# Patient Record
Sex: Female | Born: 2000 | Race: Black or African American | Hispanic: No | Marital: Single | Smoking: Former smoker
Health system: Southern US, Community
[De-identification: ages and names within clinical notes are randomized; demographics above are authoritative.]

## PROBLEM LIST (undated history)

## (undated) DIAGNOSIS — J45909 Unspecified asthma, uncomplicated: Secondary | ICD-10-CM

## (undated) DIAGNOSIS — Z9109 Other allergy status, other than to drugs and biological substances: Secondary | ICD-10-CM

---
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
## 2022-02-02 ENCOUNTER — Ambulatory Visit: Payer: BC Managed Care – PPO | Admitting: Family Medicine

## 2022-02-02 ENCOUNTER — Telehealth: Payer: Self-pay

## 2022-02-02 ENCOUNTER — Encounter: Payer: Self-pay | Admitting: Family Medicine

## 2022-02-02 ENCOUNTER — Other Ambulatory Visit: Payer: Self-pay | Admitting: Family Medicine

## 2022-02-02 VITALS — BP 102/78 | HR 57 | Temp 98.3°F | Resp 16 | Ht 64.0 in | Wt 179.8 lb

## 2022-02-02 DIAGNOSIS — B001 Herpesviral vesicular dermatitis: Secondary | ICD-10-CM

## 2022-02-02 DIAGNOSIS — J4520 Mild intermittent asthma, uncomplicated: Secondary | ICD-10-CM

## 2022-02-02 DIAGNOSIS — J302 Other seasonal allergic rhinitis: Secondary | ICD-10-CM

## 2022-02-02 DIAGNOSIS — Z0000 Encounter for general adult medical examination without abnormal findings: Secondary | ICD-10-CM | POA: Diagnosis not present

## 2022-02-02 DIAGNOSIS — L700 Acne vulgaris: Secondary | ICD-10-CM

## 2022-02-02 DIAGNOSIS — R5383 Other fatigue: Secondary | ICD-10-CM

## 2022-02-02 MED ORDER — QVAR REDIHALER 40 MCG/ACT IN AERB: 2.0000 | INHALATION_SPRAY | Freq: Two times a day (BID) | RESPIRATORY_TRACT | 5 refills | Status: DC

## 2022-02-02 MED ORDER — ALBUTEROL SULFATE HFA 108 (90 BASE) MCG/ACT IN AERS: 2.0000 | INHALATION_SPRAY | Freq: Four times a day (QID) | RESPIRATORY_TRACT | 5 refills | Status: DC | PRN

## 2022-02-02 MED ORDER — VALACYCLOVIR HCL 1 G PO TABS: 1000.0000 mg | ORAL_TABLET | Freq: Three times a day (TID) | ORAL | 5 refills | Status: AC

## 2022-02-02 MED ORDER — FLUTICASONE PROPIONATE 50 MCG/ACT NA SUSP: 2.0000 | Freq: Every day | NASAL | 5 refills | Status: DC

## 2022-02-02 MED ORDER — MINOCYCLINE HCL 50 MG PO TABS: 50.0000 mg | ORAL_TABLET | Freq: Two times a day (BID) | ORAL | 3 refills | Status: DC

## 2022-02-02 NOTE — Telephone Encounter (Signed)
Looks like Qvar needs prior auth but insurance will cover Pulmicort (which pharmacy already changed it to) or Flovent. ? ?Pharmacy comment: Alternative Requested:PA REQUIRED. NON-FORMULARY, FLOVENT PREFERRED. ?

## 2022-02-02 NOTE — Patient Instructions (Signed)
Acne Acne is a skin problem that causes pimples and other skin changes. The skin has many tiny openings called pores. Each pore contains an oil gland. Oil glands make an oily substance that is called sebum. Acne occurs when the pores in the skin get blocked. The pores may become infected with bacteria, or they may become red, sore, and swollen. Acne is a common skin problem, especially for teenagers. It often occurs on the face, neck, chest, upper arms, and back. Acne usually goes away over time. What are the causes? Acne is caused when oil glands get blocked with sebum, dead skin cells, and dirt. The bacteria that are normally found in the oil glands then multiply and cause inflammation. Acne is commonly triggered by changes in your hormones. These hormonal changes can cause the oil glands to get bigger and to make more sebum. Factors that can make acne worse include: Hormone changes during: Adolescence. Women's menstrual cycles. Pregnancy. Oil-based cosmetics and hair products. Stress. Hormone problems that are caused by certain diseases. Certain medicines. Pressure from headbands, backpacks, or shoulder pads. Exposure to certain oils and chemicals. Eating a diet high in carbohydrates that quickly turn to sugar. These include dairy products, desserts, and chocolates. What increases the risk? This condition is more likely to develop in: Teenagers. People who have a family history of acne. What are the signs or symptoms? Symptoms include: Small, red bumps (pimples or papules). Whiteheads. Blackheads. Small, pus-filled pimples (pustules). Big, red pimples or pustules that feel tender. More severe acne can cause: An abscess. This is an infected area that contains a collection of pus. Cysts. These are hard, painful, fluid-filled sacs. Scars. These can happen after large pimples heal. How is this diagnosed? This condition is diagnosed with a medical history and physical exam. Blood tests  may also be done. How is this treated? Treatment for this condition can vary depending on the severity of your acne. Treatment may include: Creams and lotions that prevent oil glands from clogging. Creams and lotions that treat or prevent infections and inflammation. Antibiotic medicines that are applied to the skin or taken as a pill. Pills that decrease sebum production. Birth control pills. Light or laser treatments. Injections of medicine into the affected areas. Chemicals that cause peeling of the skin. Surgery. Your health care provider will also recommend the best way to take care of your skin. Good skin care is the most important part of treatment. Follow these instructions at home: Skin care Take care of your skin as told by your health care provider. You may be told to do these things: Wash your skin gently at least two times each day, as well as: After you exercise. Before you go to bed. Use mild soap. Apply a water-based skin moisturizer after you wash your skin. Use a sunscreen or sunblock with SPF 30 or greater. This is especially important if you are using acne medicines. Choose cosmetics that will not block your oil glands (are noncomedogenic). Medicines Take over-the-counter and prescription medicines only as told by your health care provider. If you were prescribed an antibiotic medicine, apply it or take it as told by your health care provider. Do not stop using the antibiotic even if your condition improves. General instructions Keep your hair clean and off your face. If you have oily hair, shampoo your hair regularly or daily. Avoid wearing tight headbands or hats. Avoid picking or squeezing your pimples. That can make your acne worse and cause scarring. Shave gently and   only when necessary. Keep a food journal to figure out if any foods are linked to your acne. Avoid dairy products, desserts, and chocolates. Take steps to manage and reduce stress. Keep all  follow-up visits as told by your health care provider. This is important. Contact a health care provider if: Your acne is not better after eight weeks. Your acne gets worse. You have a large area of skin that is red or tender. You think that you are having side effects from any acne medicine. Summary Acne is a skin problem that causes pimples and other skin changes. Acne is a common skin problem, especially for teenagers. Acne usually goes away over time. Acne is commonly triggered by changes in your hormones. There are many other causes, such as stress, diet, and certain medicines. Follow your health care provider's instructions for how to take care of your skin. Good skin care is the most important part of treatment. Take over-the-counter and prescription medicines only as told by your health care provider. Contact your health care provider if you think that you are having side effects from any acne medicine. This information is not intended to replace advice given to you by your health care provider. Make sure you discuss any questions you have with your health care provider. Document Revised: 03/01/2018 Document Reviewed: 03/01/2018 Elsevier Patient Education  2022 [employer]  

## 2022-02-02 NOTE — Progress Notes (Signed)
? ?Subjective:  ? ?By signing my name below, I, Madison Hansen, attest that this documentation has been prepared under the direction and in the presence of Madison Spates DO, 02/02/2022   ? ? Patient ID: Madison Hansen, female    DOB: 03-20-2001, 21 y.o.   MRN: [id number] ? ?Chief Complaint  ?Patient presents with  ? New Patient (Initial Visit)  ? ? ?HPI ?Patient is in today for establishment of patient care. ? ?She complains of a cold sore on the lower lip that arisen 2 weeks ago. She notes that she occasionally does get cold sores. However, her recent cold sore has been persistent.  ? ?She complains of painful cramps during menstruation and pimple flare ups. Her pain is persistent for the first 3 to 4 days. She has regular periods and her cycle is between 20 - 21 days. She takes Pamprin to help alleviate pain. She is not interested in receiving birth control pills.  ? ?She complains of fatigue. She states that she has been going to sleep between 02:00 - 03:00 am and occasionally at 05:00 am. She gets up and goes to the gym around 06:00 am. She denies the possibility of pregnancy, STDs, or urinary symptoms.  ? ?She states that her allergies have been elevated. She reports that her allergies have been seasonal. She has been taking Claritin and OTC eye drops.  ? ?Her asthma has been well, she has been taking Albuterol a couple of times throughout the week.  ?  ?She reports that her grandmother has heart disease.  ?She regularly exercises by jogging and weight training.  ? ? ?Past Medical History:  ?Diagnosis Date  ? Asthma   ? Environmental allergies   ? ? ?Past Surgical History:  ?Procedure Laterality Date  ? TONSILLECTOMY  2008  ? ? ?No family history on file. ? ?Social History  ? ?Socioeconomic History  ? Marital status: Single  ?  Spouse name: Not on file  ? Number of children: Not on file  ? Years of education: Not on file  ? Highest education level: Not on file  ?Occupational History  ? Not on file   ?Tobacco Use  ? Smoking status: Former  ?  Types: E-cigarettes  ? Smokeless tobacco: Never  ?Vaping Use  ? Vaping Use: Former  ?Substance and Sexual Activity  ? Alcohol use: No  ? Drug use: No  ? Sexual activity: Not Currently  ?Other Topics Concern  ? Not on file  ?Social History Narrative  ? Not on file  ? ?Social Determinants of Health  ? ?Financial Resource Strain: Not on file  ?Food Insecurity: Not on file  ?Transportation Needs: Not on file  ?Physical Activity: Not on file  ?Stress: Not on file  ?Social Connections: Not on file  ?Intimate Partner Violence: Not on file  ? ? ?Outpatient Medications Prior to Visit  ?Medication Sig Dispense Refill  ? beclomethasone (QVAR) 40 MCG/ACT inhaler Inhale 2 puffs into the lungs 2 (two) times daily.    ? ibuprofen (ADVIL,MOTRIN) 100 MG chewable tablet Chew 200 mg by mouth every 8 (eight) hours as needed. For fever    ? loratadine (CLARITIN) 5 MG chewable tablet Chew 5 mg by mouth daily.    ? albuterol (PROVENTIL HFA;VENTOLIN HFA) 108 (90 BASE) MCG/ACT inhaler Inhale 2 puffs into the lungs every 6 (six) hours as needed. For shortness of breath    ? fluticasone (FLONASE) 50 MCG/ACT nasal spray Place 2 sprays into the nose daily.    ?  sertraline (ZOLOFT) 50 MG tablet Take 50 mg by mouth daily.    ? acetaminophen (TYLENOL) 80 MG chewable tablet Chew 240 mg by mouth every 4 (four) hours as needed. For fever (Patient not taking: Reported on 02/02/2022)    ? azithromycin (ZITHROMAX) 200 MG/5ML suspension Take 12.5 mLs (500 mg total) by mouth daily. 500mg  po qday day 1 then 250mg  po qday days 2-5 qs (Patient not taking: Reported on 02/02/2022) 37.5 mL 0  ? montelukast (SINGULAIR) 5 MG chewable tablet Chew 5 mg by mouth at bedtime. (Patient not taking: Reported on 02/02/2022)    ? ?No facility-administered medications prior to visit.  ? ? ?Allergies  ?Allergen Reactions  ? Other   ?  Lima Beans: Unknown  ? ? ?Review of Systems  ?Constitutional:  Positive for malaise/fatigue.   ?Gastrointestinal:  Positive for abdominal pain (During Menstruation).  ?Genitourinary: Negative.   ?Skin:   ?     (+) Pimple Flare - ups ?(+) Cold Sore on Lower Lip  ?Endo/Heme/Allergies:  Positive for environmental allergies.  ? ?   ?Objective:  ?  ?Physical Exam ?Constitutional:   ?   General: She is not in acute distress. ?   Appearance: Normal appearance. She is not ill-appearing.  ?HENT:  ?   Head: Normocephalic and atraumatic.  ?   Right Ear: Tympanic membrane, ear canal and external ear normal.  ?   Left Ear: Tympanic membrane, ear canal and external ear normal.  ?Eyes:  ?   Extraocular Movements: Extraocular movements intact.  ?   Pupils: Pupils are equal, round, and reactive to light.  ?Cardiovascular:  ?   Rate and Rhythm: Normal rate and regular rhythm.  ?   Heart sounds: Normal heart sounds. No murmur heard. ?  No gallop.  ?Pulmonary:  ?   Effort: Pulmonary effort is normal. No respiratory distress.  ?   Breath sounds: Normal breath sounds. No wheezing or rales.  ?Abdominal:  ?   General: Bowel sounds are normal. There is no distension.  ?   Palpations: Abdomen is soft.  ?   Tenderness: There is no abdominal tenderness. There is no guarding.  ?Skin: ?   General: Skin is warm and dry.  ?Neurological:  ?   Mental Status: She is alert and oriented to person, place, and time.  ?Psychiatric:     ?   Judgment: Judgment normal.  ? ? ?BP 102/78 (BP Location: Left Arm, Patient Position: Sitting, Cuff Size: Normal)   Pulse (!) 57   Temp 98.3 ?F (36.8 ?C) (Oral)   Resp 16   Ht 5\' 4"  (1.626 m)   Wt 179 lb 12.8 oz (81.6 kg)   LMP 01/15/2022   SpO2 99%   BMI 30.86 kg/m?  ?Wt Readings from Last 3 Encounters:  ?02/02/22 179 lb 12.8 oz (81.6 kg) (95 %, Z= 1.62)*  ?09/24/18 188 lb 15 oz (85.7 kg) (97 %, Z= 1.95)*  ?11/05/12 129 lb 1.6 oz (58.6 kg) (>99 %, Z= 2.39)*  ? ?* Growth percentiles are based on CDC (Girls, 2-20 Years) data.  ? ? ?Diabetic Foot Exam - Simple   ?No data filed ?  ? ?Lab Results  ?Component  Value Date  ? WBC 5.7 02/02/2022  ? HGB 12.2 02/02/2022  ? HCT 36.2 02/02/2022  ? PLT 226.0 02/02/2022  ? GLUCOSE 69 (L) 02/02/2022  ? CHOL 129 02/02/2022  ? TRIG 69.0 02/02/2022  ? HDL 46.50 02/02/2022  ? LDLCALC 68 02/02/2022  ? ALT  9 02/02/2022  ? AST 17 02/02/2022  ? NA 139 02/02/2022  ? K 3.9 02/02/2022  ? CL 106 02/02/2022  ? CREATININE 0.77 02/02/2022  ? BUN 9 02/02/2022  ? CO2 26 02/02/2022  ? TSH 1.72 02/02/2022  ? ? ?Lab Results  ?Component Value Date  ? TSH 1.72 02/02/2022  ? ?Lab Results  ?Component Value Date  ? WBC 5.7 02/02/2022  ? HGB 12.2 02/02/2022  ? HCT 36.2 02/02/2022  ? MCV 87.4 02/02/2022  ? PLT 226.0 02/02/2022  ? ?Lab Results  ?Component Value Date  ? NA 139 02/02/2022  ? K 3.9 02/02/2022  ? CO2 26 02/02/2022  ? GLUCOSE 69 (L) 02/02/2022  ? BUN 9 02/02/2022  ? CREATININE 0.77 02/02/2022  ? BILITOT 0.4 02/02/2022  ? ALKPHOS 35 (L) 02/02/2022  ? AST 17 02/02/2022  ? ALT 9 02/02/2022  ? PROT 6.4 02/02/2022  ? ALBUMIN 4.1 02/02/2022  ? CALCIUM 9.0 02/02/2022  ? ANIONGAP 9 09/24/2018  ? GFR 112.07 02/02/2022  ? ?Lab Results  ?Component Value Date  ? CHOL 129 02/02/2022  ? ?Lab Results  ?Component Value Date  ? HDL 46.50 02/02/2022  ? ?Lab Results  ?Component Value Date  ? LDLCALC 68 02/02/2022  ? ?Lab Results  ?Component Value Date  ? TRIG 69.0 02/02/2022  ? ?Lab Results  ?Component Value Date  ? CHOLHDL 3 02/02/2022  ? ?No results found for: HGBA1C ? ?   ?Assessment & Plan:  ? ?Problem List Items Addressed This Visit   ? ?  ? Unprioritized  ? Seasonal allergies  ? Relevant Medications  ? albuterol (VENTOLIN HFA) 108 (90 Base) MCG/ACT inhaler  ? fluticasone (FLONASE) 50 MCG/ACT nasal spray  ? Preventative health care - Primary  ?  ghm utd ?Check labs  ?See avs ? ?  ?  ? Relevant Orders  ? CBC with Differential/Platelet (Completed)  ? Comprehensive metabolic panel (Completed)  ? Lipid panel (Completed)  ? TSH (Completed)  ? Vitamin B12 (Completed)  ? VITAMIN D 25 Hydroxy (Vit-D Deficiency,  Fractures) (Completed)  ? Mild intermittent asthma without complication  ? Relevant Medications  ? albuterol (VENTOLIN HFA) 108 (90 Base) MCG/ACT inhaler  ? fluticasone (FLONASE) 50 MCG/ACT nasal spray  ? Cold sore  ? Acne vulgari

## 2022-02-02 NOTE — Telephone Encounter (Signed)
Pt's mother called back with pt present (Mom is on [place]) and stated pt forgot to ask for refill on Sertraline.  Pt will be out of the medication in 2 weeks. Pharmacy for refill will be [facility] on [address] ?

## 2022-02-03 ENCOUNTER — Other Ambulatory Visit: Payer: Self-pay | Admitting: Family Medicine

## 2022-02-03 LAB — COMPREHENSIVE METABOLIC PANEL
ALT: 9 U/L (ref 0–35)
AST: 17 U/L (ref 0–37)
Albumin: 4.1 g/dL (ref 3.5–5.2)
Alkaline Phosphatase: 35 U/L — ABNORMAL LOW (ref 47–119)
BUN: 9 mg/dL (ref 6–23)
CO2: 26 mEq/L (ref 19–32)
Calcium: 9 mg/dL (ref 8.4–10.5)
Chloride: 106 mEq/L (ref 96–112)
Creatinine, Ser: 0.77 mg/dL (ref 0.40–1.20)
GFR: 112.07 mL/min (ref >60.00)
Glucose, Bld: 69 mg/dL — ABNORMAL LOW (ref 70–99)
Potassium: 3.9 mEq/L (ref 3.5–5.1)
Sodium: 139 mEq/L (ref 135–145)
Total Bilirubin: 0.4 mg/dL (ref 0.2–1.2)
Total Protein: 6.4 g/dL (ref 6.0–8.3)

## 2022-02-03 LAB — CBC WITH DIFFERENTIAL/PLATELET
Basophils Absolute: 0 10*3/uL (ref 0.0–0.1)
Basophils Relative: 0.7 % (ref 0.0–3.0)
Eosinophils Absolute: 0.5 10*3/uL (ref 0.0–0.7)
Eosinophils Relative: 9.1 % — ABNORMAL HIGH (ref 0.0–5.0)
HCT: 36.2 % (ref 36.0–49.0)
Hemoglobin: 12.2 g/dL (ref 12.0–16.0)
Lymphocytes Relative: 30.5 % (ref 24.0–48.0)
Lymphs Abs: 1.7 10*3/uL (ref 0.7–4.0)
MCHC: 33.8 g/dL (ref 31.0–37.0)
MCV: 87.4 fl (ref 78.0–98.0)
Monocytes Absolute: 0.4 10*3/uL (ref 0.1–1.0)
Monocytes Relative: 6.7 % (ref 3.0–12.0)
Neutro Abs: 3 10*3/uL (ref 1.4–7.7)
Neutrophils Relative %: 53 % (ref 43.0–71.0)
Platelets: 226 10*3/uL (ref 150.0–575.0)
RBC: 4.15 Mil/uL (ref 3.80–5.70)
RDW: 13.1 % (ref 11.4–15.5)
WBC: 5.7 10*3/uL (ref 4.5–13.5)

## 2022-02-03 LAB — LIPID PANEL
Cholesterol: 129 mg/dL (ref 0–200)
HDL: 46.5 mg/dL (ref >39.00)
LDL Cholesterol: 68 mg/dL (ref 0–99)
NonHDL: 82.14
Total CHOL/HDL Ratio: 3
Triglycerides: 69 mg/dL (ref 0.0–149.0)
VLDL: 13.8 mg/dL (ref 0.0–40.0)

## 2022-02-03 LAB — VITAMIN B12: Vitamin B-12: 237 pg/mL (ref 211–911)

## 2022-02-03 LAB — TSH: TSH: 1.72 u[IU]/mL (ref 0.40–5.00)

## 2022-02-03 LAB — VITAMIN D 25 HYDROXY (VIT D DEFICIENCY, FRACTURES): VITD: 7.11 ng/mL — ABNORMAL LOW (ref 30.00–100.00)

## 2022-02-03 MED ORDER — SERTRALINE HCL 50 MG PO TABS: 50.0000 mg | ORAL_TABLET | Freq: Every day | ORAL | 1 refills | Status: DC

## 2022-02-03 NOTE — Telephone Encounter (Signed)
Did you discuss taking over this medication? ?

## 2022-02-03 NOTE — Telephone Encounter (Signed)
Patient notified

## 2022-02-10 MED ORDER — ERGOCALCIFEROL 1.25 MG (50000 UT) PO CAPS: 50000.0000 [IU] | ORAL_CAPSULE | ORAL | 1 refills | Status: AC

## 2022-02-15 DIAGNOSIS — J4520 Mild intermittent asthma, uncomplicated: Secondary | ICD-10-CM | POA: Insufficient documentation

## 2022-02-15 DIAGNOSIS — B001 Herpesviral vesicular dermatitis: Secondary | ICD-10-CM | POA: Insufficient documentation

## 2022-02-15 DIAGNOSIS — Z0000 Encounter for general adult medical examination without abnormal findings: Secondary | ICD-10-CM | POA: Insufficient documentation

## 2022-02-15 DIAGNOSIS — J302 Other seasonal allergic rhinitis: Secondary | ICD-10-CM | POA: Insufficient documentation

## 2022-02-15 DIAGNOSIS — L700 Acne vulgaris: Secondary | ICD-10-CM | POA: Insufficient documentation

## 2022-02-15 NOTE — Assessment & Plan Note (Signed)
ghm utd Check labs  See avs  

## 2022-05-04 ENCOUNTER — Encounter: Payer: Self-pay | Admitting: Family Medicine

## 2022-05-04 ENCOUNTER — Telehealth: Payer: Self-pay | Admitting: Family Medicine

## 2022-05-04 NOTE — Telephone Encounter (Signed)
Pt states she is having constant migraines with neck and back pain. Could not schedule an appt with her as she is in [place] right now, and even virtual won't bill correctly as she is out of state. She was seen at [facility] but medications are not helping. She would like to know if pcp can do anything.

## 2022-05-04 NOTE — Telephone Encounter (Signed)
Spoke with patient regarding appointment/pain.  Recommended to patient to try Tylenol as suggested by UC. Patient states she will start today.  Per UC note/provider, suggested patient return to UC for further evaluation/treatment if pain persists and since she is out of state.  Advised note will be sent to PCP for recommendations, however without PCP seeing patient, unlikely to be able to treat.  Patient verbalized understanding.

## 2022-05-14 ENCOUNTER — Telehealth: Payer: Self-pay | Admitting: Family Medicine

## 2022-05-14 NOTE — Telephone Encounter (Signed)
Patient states she is currently in [place] and a few days ago she went to the ED due to a migraine that was accompanied by back and neck pain. They did labs and advised her to follow up with PCP and a possible referral to a neurologist. Advised pt that she would most likely need to be assessed during an OV but she stated she wont be back in [place] until August 10th. OV was made for that date but pt would like to know if Dr. Laury Axon knows someone up in [place] she can see until then. They would like a call back to discuss. Please advise.

## 2022-05-18 NOTE — Telephone Encounter (Signed)
Pt called back. Left VM with below options

## 2022-05-22 ENCOUNTER — Other Ambulatory Visit: Payer: Self-pay

## 2022-05-22 DIAGNOSIS — H547 Unspecified visual loss: Secondary | ICD-10-CM

## 2022-05-22 DIAGNOSIS — H4710 Unspecified papilledema: Secondary | ICD-10-CM

## 2022-05-25 ENCOUNTER — Other Ambulatory Visit: Payer: Self-pay

## 2022-05-25 DIAGNOSIS — G932 Benign intracranial hypertension: Secondary | ICD-10-CM

## 2022-06-11 ENCOUNTER — Ambulatory Visit: Payer: BC Managed Care – PPO | Admitting: Family Medicine

## 2022-06-15 ENCOUNTER — Ambulatory Visit (INDEPENDENT_AMBULATORY_CARE_PROVIDER_SITE_OTHER): Payer: BC Managed Care – PPO | Admitting: Family Medicine

## 2022-06-15 ENCOUNTER — Encounter: Payer: Self-pay | Admitting: Family Medicine

## 2022-06-15 VITALS — BP 112/70 | HR 66 | Temp 97.7°F | Resp 16 | Ht 64.01 in | Wt 192.2 lb

## 2022-06-15 DIAGNOSIS — N760 Acute vaginitis: Secondary | ICD-10-CM | POA: Diagnosis not present

## 2022-06-15 DIAGNOSIS — R82998 Other abnormal findings in urine: Secondary | ICD-10-CM

## 2022-06-15 DIAGNOSIS — H547 Unspecified visual loss: Secondary | ICD-10-CM

## 2022-06-15 DIAGNOSIS — E669 Obesity, unspecified: Secondary | ICD-10-CM

## 2022-06-15 DIAGNOSIS — G932 Benign intracranial hypertension: Secondary | ICD-10-CM | POA: Insufficient documentation

## 2022-06-15 LAB — POC URINALSYSI DIPSTICK (AUTOMATED)
Bilirubin, UA: NEGATIVE
Blood, UA: NEGATIVE
Glucose, UA: NEGATIVE
Nitrite, UA: NEGATIVE
Protein, UA: NEGATIVE
Spec Grav, UA: 1.025 (ref 1.010–1.025)
Urobilinogen, UA: 0.2 E.U./dL
pH, UA: 6 (ref 5.0–8.0)

## 2022-06-15 MED ORDER — FLUCONAZOLE 150 MG PO TABS: ORAL_TABLET | ORAL | 0 refills | Status: DC

## 2022-06-15 NOTE — Patient Instructions (Signed)
Idiopathic Intracranial Hypertension  Idiopathic intracranial hypertension (IIH) is a condition that increases pressure around the brain. The fluid that surrounds the brain and spinal cord (cerebrospinal fluid, or CSF) increases and causes the pressure. Idiopathic means that the cause of this condition is not known. IIH affects the brain and spinal cord (neurological disorder). If this condition is not treated, it can cause vision loss or blindness. What are the causes? The cause of this condition is not known. What increases the risk? The following factors may make you more likely to develop this condition: Being very overweight (obese). Being a female between the ages of 63 and 70 years old, who has not gone through menopause. Taking certain medicines, such as birth control or steroids. What are the signs or symptoms? Symptoms of this condition include: Headaches. This is the most common symptom. Brief episodes of total blindness. Double vision, blurred vision, or poor side (peripheral) vision. Pain in the shoulders or neck. Nausea and vomiting. A sound like rushing water or a pulsing sound within the ears (pulsatile tinnitus), or ringing in the ears. How is this diagnosed? This condition may be diagnosed based on: Your symptoms and medical history. Imaging tests of the brain, such as: CT scan. MRI. Magnetic resonance venogram (MRV) to check the veins. Diagnostic lumbar puncture. This is a procedure to remove and examine a sample of cerebrospinal fluid. This procedure can determine whether too much fluid may be causing IIH. A thorough eye exam to check for swelling or nerve damage in the eyes. How is this treated? Treatment for this condition depends on the symptoms. The goal of treatment is to decrease the pressure around your brain. Common treatments include: Weight loss through healthy eating, salt restriction, and exercise, if you are overweight. Medicines to decrease the  production of spinal fluid and lower the pressure within your skull. Medicines to prevent or treat headaches. Other treatments may include: Surgery to place drains (shunts) in your brain for removing excess fluid. Lumbar puncture to remove excess cerebrospinal fluid. Follow these instructions at home: If you are overweight or obese, work with your health care provider to lose weight. Take over-the-counter and prescription medicines only as told by your health care provider. Ask your health care provider if the medicine prescribed to you requires you to avoid driving or using machinery. Do not use any products that contain nicotine or tobacco, such as cigarettes, e-cigarettes, and chewing tobacco. If you need help quitting, ask your health care provider. Keep all follow-up visits as told by your health care provider. This is important. Contact a health care provider if: You have changes in your vision, such as: Double vision. Blurred vision. Poor peripheral vision. Get help right away if: You have any of the following symptoms and they get worse or do not get better: Headaches. Nausea. Vomiting. Sudden trouble seeing. Summary Idiopathic intracranial hypertension (IIH) is a condition that increases pressure around the brain. The cause is not known (is idiopathic). The most common symptom of IIH is headaches. Vision changes, pain in the shoulders or neck, nausea, and vomiting may also occur. Treatment for this condition depends on your symptoms. The goal of treatment is to decrease the pressure around your brain. If you are overweight or obese, work with your health care provider to lose weight. Take over-the-counter and prescription medicines only as told by your health care provider. This information is not intended to replace advice given to you by your health care provider. Make sure you discuss  any questions you have with your health care provider. Document Revised: 09/30/2019 Document  Reviewed: 09/30/2019 Elsevier Patient Education  2023 Elsevier Inc.  

## 2022-06-15 NOTE — Assessment & Plan Note (Addendum)
Neurology scheduling LP Labs drawn per neuro request Notes from [facility] reviewed

## 2022-06-15 NOTE — Progress Notes (Signed)
Subjective:   By signing my name below, I, [name], attest that this documentation has been prepared under the direction and in the presence of [name] DO 06/15/2022   Patient ID: Madison Hansen, female    DOB: [birth year], 21 y.o.   MRN: [id number]  Chief Complaint  Patient presents with   Acne   Follow-up    HPI Patient is in today for an office visit. She is accompanied by her mother.   Her acne is okay. She has stopped taking 50 Mg of Minocycline per recommendation of Dr. Colletta Maryland.   Her mother was expecting a phone call from [facility] a few days ago for the patient to receive a lumbar puncture on 06/17/2022. However, the mother has not yet received a call from the office. The patient reports that her headaches are better but her change of vision has not yet returned. Her mother was informed that the patient needs a full blood work panel done during today's visit. The patient reports her IIH begun suddenly while she was out of town in [place].   Past Medical History:  Diagnosis Date   Asthma    Environmental allergies     Past Surgical History:  Procedure Laterality Date   TONSILLECTOMY  2008    No family history on file.  Social History   Socioeconomic History   Marital status: Single    Spouse name: Not on file   Number of children: Not on file   Years of education: Not on file   Highest education level: Not on file  Occupational History   Not on file  Tobacco Use   Smoking status: Former    Types: E-cigarettes   Smokeless tobacco: Never  Vaping Use   Vaping Use: Former  Substance and Sexual Activity   Alcohol use: No   Drug use: No   Sexual activity: Not Currently  Other Topics Concern   Not on file  Social History Narrative   Not on file   Social Determinants of Health   Financial Resource Strain: Not on file  Food Insecurity: Not on file  Transportation Needs: Not on file  Physical Activity: Not on file  Stress:  Not on file  Social Connections: Not on file  Intimate Partner Violence: Not on file    Outpatient Medications Prior to Visit  Medication Sig Dispense Refill   albuterol (VENTOLIN HFA) 108 (90 Base) MCG/ACT inhaler Inhale 2 puffs into the lungs every 6 (six) hours as needed. For shortness of breath 1 each 5   beclomethasone (QVAR) 40 MCG/ACT inhaler Inhale 2 puffs into the lungs 2 (two) times daily.     Budesonide (PULMICORT FLEXHALER) 90 MCG/ACT inhaler Inhale 1 puff into the lungs 2 (two) times daily. 1 each 5   ergocalciferol (VITAMIN D2) 1.25 MG (50000 UT) capsule Take 1 capsule (50,000 Units total) by mouth once a week. 12 capsule 1   fluticasone (FLONASE) 50 MCG/ACT nasal spray Place 2 sprays into both nostrils daily. 18.2 mL 5   ibuprofen (ADVIL,MOTRIN) 100 MG chewable tablet Chew 200 mg by mouth every 8 (eight) hours as needed. For fever     loratadine (CLARITIN) 5 MG chewable tablet Chew 5 mg by mouth daily.     minocycline (DYNACIN) 50 MG tablet Take 1 tablet (50 mg total) by mouth 2 (two) times daily. 60 tablet 3   sertraline (ZOLOFT) 50 MG tablet Take 1 tablet (50 mg total) by mouth daily. 90  tablet 1   No facility-administered medications prior to visit.    Allergies  Allergen Reactions   Other     Lima Beans: Unknown    Review of Systems  Constitutional:  Negative for fever and malaise/fatigue.  HENT:  Negative for congestion.   Eyes:  Positive for blurred vision.  Respiratory:  Negative for shortness of breath.   Cardiovascular:  Negative for chest pain, palpitations and leg swelling.  Gastrointestinal:  Negative for abdominal pain, blood in stool and nausea.  Genitourinary:  Negative for dysuria and frequency.  Musculoskeletal:  Negative for falls.  Skin:  Negative for rash.  Neurological:  Negative for dizziness, loss of consciousness and headaches.  Endo/Heme/Allergies:  Negative for environmental allergies.  Psychiatric/Behavioral:  Negative for depression.  The patient is not nervous/anxious.        Objective:    Physical Exam Vitals and nursing note reviewed.  Constitutional:      General: She is not in acute distress.    Appearance: Normal appearance. She is not ill-appearing.  HENT:     Head: Normocephalic and atraumatic.     Right Ear: External ear normal.     Left Ear: External ear normal.  Cardiovascular:     Rate and Rhythm: Normal rate and regular rhythm.     Heart sounds: Normal heart sounds. No murmur heard.    No gallop.  Pulmonary:     Effort: Pulmonary effort is normal. No respiratory distress.     Breath sounds: Normal breath sounds. No wheezing or rales.  Skin:    General: Skin is warm and dry.  Neurological:     Mental Status: She is alert and oriented to person, place, and time.  Psychiatric:        Judgment: Judgment normal.     BP 112/70 (BP Location: Left Arm, Patient Position: Sitting, Cuff Size: Normal)   Pulse 66   Temp 97.7 F (36.5 C) (Oral)   Resp 16   Ht 5' 4.01" (1.626 m)   Wt 192 lb 3.2 oz (87.2 kg)   SpO2 100%   BMI 32.98 kg/m  Wt Readings from Last 3 Encounters:  06/15/22 192 lb 3.2 oz (87.2 kg) (97 %, Z= 1.83)*  02/02/22 179 lb 12.8 oz (81.6 kg) (95 %, Z= 1.62)*  09/24/18 188 lb 15 oz (85.7 kg) (97 %, Z= 1.95)*   * Growth percentiles are based on CDC (Girls, 2-20 Years) data.    Diabetic Foot Exam - Simple   No data filed    Lab Results  Component Value Date   WBC 5.7 02/02/2022   HGB 12.2 02/02/2022   HCT 36.2 02/02/2022   PLT 226.0 02/02/2022   GLUCOSE 69 (L) 02/02/2022   CHOL 129 02/02/2022   TRIG 69.0 02/02/2022   HDL 46.50 02/02/2022   LDLCALC 68 02/02/2022   ALT 9 02/02/2022   AST 17 02/02/2022   NA 139 02/02/2022   K 3.9 02/02/2022   CL 106 02/02/2022   CREATININE 0.77 02/02/2022   BUN 9 02/02/2022   CO2 26 02/02/2022   TSH 1.72 02/02/2022    Lab Results  Component Value Date   TSH 1.72 02/02/2022   Lab Results  Component Value Date   WBC 5.7  02/02/2022   HGB 12.2 02/02/2022   HCT 36.2 02/02/2022   MCV 87.4 02/02/2022   PLT 226.0 02/02/2022   Lab Results  Component Value Date   NA 139 02/02/2022   K 3.9 02/02/2022  CO2 26 02/02/2022   GLUCOSE 69 (L) 02/02/2022   BUN 9 02/02/2022   CREATININE 0.77 02/02/2022   BILITOT 0.4 02/02/2022   ALKPHOS 35 (L) 02/02/2022   AST 17 02/02/2022   ALT 9 02/02/2022   PROT 6.4 02/02/2022   ALBUMIN 4.1 02/02/2022   CALCIUM 9.0 02/02/2022   ANIONGAP 9 09/24/2018   GFR 112.07 02/02/2022   Lab Results  Component Value Date   CHOL 129 02/02/2022   Lab Results  Component Value Date   HDL 46.50 02/02/2022   Lab Results  Component Value Date   LDLCALC 68 02/02/2022   Lab Results  Component Value Date   TRIG 69.0 02/02/2022   Lab Results  Component Value Date   CHOLHDL 3 02/02/2022   No results found for: "HGBA1C"     Assessment & Plan:   Problem List Items Addressed This Visit       Unprioritized   Idiopathic intracranial hypertension - Primary    Neurology scheduling LP Labs drawn per neuro request       Relevant Orders   CBC with Differential/Platelet   Comprehensive metabolic panel   Lipid panel   TSH   Vitamin B12   VITAMIN D 25 Hydroxy (Vit-D Deficiency, Fractures)   POCT Urinalysis Dipstick (Automated) (Completed)   RPR   Bartonella anitbody panel   Amb Ref to Medical Weight Management   Other Visit Diagnoses     Vision loss       Relevant Orders   CBC with Differential/Platelet   Comprehensive metabolic panel   Lipid panel   TSH   Vitamin B12   VITAMIN D 25 Hydroxy (Vit-D Deficiency, Fractures)   POCT Urinalysis Dipstick (Automated) (Completed)   Acute vaginitis       Relevant Medications   fluconazole (DIFLUCAN) 150 MG tablet   Obesity (BMI 30.0-34.9)       Relevant Orders   Amb Ref to Medical Weight Management   Leukocytes in urine       Relevant Orders   Urine Culture      Meds ordered this encounter  Medications    fluconazole (DIFLUCAN) 150 MG tablet    Sig: 1 po x1, may repeat in 3 days prn    Dispense:  2 tablet    Refill:  0    I, [name], DO, personally preformed the services described in this documentation.  All medical record entries made by the scribe were at my direction and in my presence.  I have reviewed the chart and discharge instructions (if applicable) and agree that the record reflects my personal performance and is accurate and complete. 06/15/2022   I,Amber Collins,acting as a scribe for [name], DO.,have documented all relevant documentation on the behalf of [name], DO,as directed by  [name], DO while in the presence of [name], DO.   [name], DO

## 2022-06-16 LAB — CBC WITH DIFFERENTIAL/PLATELET
Basophils Absolute: 0 10*3/uL (ref 0.0–0.1)
Basophils Relative: 0.5 % (ref 0.0–3.0)
Eosinophils Absolute: 0.1 10*3/uL (ref 0.0–0.7)
Eosinophils Relative: 1.8 % (ref 0.0–5.0)
HCT: 38.3 % (ref 36.0–49.0)
Hemoglobin: 12.7 g/dL (ref 12.0–16.0)
Lymphocytes Relative: 32.7 % (ref 24.0–48.0)
Lymphs Abs: 1.6 10*3/uL (ref 0.7–4.0)
MCHC: 33.2 g/dL (ref 31.0–37.0)
MCV: 89.2 fl (ref 78.0–98.0)
Monocytes Absolute: 0.4 10*3/uL (ref 0.1–1.0)
Monocytes Relative: 8.2 % (ref 3.0–12.0)
Neutro Abs: 2.8 10*3/uL (ref 1.4–7.7)
Neutrophils Relative %: 56.8 % (ref 43.0–71.0)
Platelets: 258 10*3/uL (ref 150.0–575.0)
RBC: 4.29 Mil/uL (ref 3.80–5.70)
RDW: 14.2 % (ref 11.4–15.5)
WBC: 5 10*3/uL (ref 4.5–13.5)

## 2022-06-16 LAB — COMPREHENSIVE METABOLIC PANEL
ALT: 16 U/L (ref 0–35)
AST: 26 U/L (ref 0–37)
Albumin: 4.3 g/dL (ref 3.5–5.2)
Alkaline Phosphatase: 43 U/L — ABNORMAL LOW (ref 47–119)
BUN: 8 mg/dL (ref 6–23)
CO2: 17 mEq/L — ABNORMAL LOW (ref 19–32)
Calcium: 8.5 mg/dL (ref 8.4–10.5)
Chloride: 111 mEq/L (ref 96–112)
Creatinine, Ser: 0.95 mg/dL (ref 0.40–1.20)
GFR: 86.88 mL/min (ref >60.00)
Glucose, Bld: 60 mg/dL — ABNORMAL LOW (ref 70–99)
Potassium: 3.8 mEq/L (ref 3.5–5.1)
Sodium: 138 mEq/L (ref 135–145)
Total Bilirubin: 0.4 mg/dL (ref 0.2–1.2)
Total Protein: 6.9 g/dL (ref 6.0–8.3)

## 2022-06-16 LAB — LIPID PANEL
Cholesterol: 120 mg/dL (ref 0–200)
HDL: 39.7 mg/dL (ref >39.00)
LDL Cholesterol: 67 mg/dL (ref 0–99)
NonHDL: 80.14
Total CHOL/HDL Ratio: 3
Triglycerides: 66 mg/dL (ref 0.0–149.0)
VLDL: 13.2 mg/dL (ref 0.0–40.0)

## 2022-06-16 LAB — VITAMIN B12: Vitamin B-12: 330 pg/mL (ref 211–911)

## 2022-06-16 LAB — RPR: RPR Ser Ql: NONREACTIVE

## 2022-06-16 LAB — VITAMIN D 25 HYDROXY (VIT D DEFICIENCY, FRACTURES): VITD: 17.71 ng/mL — ABNORMAL LOW (ref 30.00–100.00)

## 2022-06-16 LAB — TSH: TSH: 1.35 u[IU]/mL (ref 0.40–5.00)

## 2022-06-17 LAB — URINE CULTURE
MICRO NUMBER:: 13775213
SPECIMEN QUALITY:: ADEQUATE

## 2022-06-18 LAB — BARTONELLA ANTIBODY PANEL
B. henselae IgG Screen: NEGATIVE
B. henselae IgM Screen: NEGATIVE

## 2022-06-19 ENCOUNTER — Encounter: Payer: Self-pay | Admitting: Family Medicine

## 2022-06-19 MED ORDER — VALACYCLOVIR HCL 1 G PO TABS: 1000.0000 mg | ORAL_TABLET | Freq: Three times a day (TID) | ORAL | 0 refills | Status: AC

## 2022-08-25 ENCOUNTER — Other Ambulatory Visit: Payer: Self-pay | Admitting: Family Medicine

## 2022-09-29 ENCOUNTER — Encounter: Payer: Self-pay | Admitting: Family Medicine

## 2022-09-29 ENCOUNTER — Ambulatory Visit: Payer: BC Managed Care – PPO | Admitting: Family Medicine

## 2022-09-29 VITALS — BP 120/90 | HR 54 | Temp 98.0°F | Wt 191.0 lb

## 2022-09-29 DIAGNOSIS — F3281 Premenstrual dysphoric disorder: Secondary | ICD-10-CM | POA: Insufficient documentation

## 2022-09-29 DIAGNOSIS — R5383 Other fatigue: Secondary | ICD-10-CM | POA: Diagnosis not present

## 2022-09-29 DIAGNOSIS — R1013 Epigastric pain: Secondary | ICD-10-CM

## 2022-09-29 DIAGNOSIS — F338 Other recurrent depressive disorders: Secondary | ICD-10-CM

## 2022-09-29 DIAGNOSIS — R1011 Right upper quadrant pain: Secondary | ICD-10-CM

## 2022-09-29 LAB — POC URINALSYSI DIPSTICK (AUTOMATED)
Bilirubin, UA: NEGATIVE
Glucose, UA: NEGATIVE
Ketones, UA: NEGATIVE
Leukocytes, UA: NEGATIVE
Nitrite, UA: NEGATIVE
Protein, UA: NEGATIVE
Spec Grav, UA: 1.01 (ref 1.010–1.025)
Urobilinogen, UA: 0.2 E.U./dL
pH, UA: 7.5 (ref 5.0–8.0)

## 2022-09-29 LAB — POCT URINE PREGNANCY: Preg Test, Ur: NEGATIVE

## 2022-09-29 MED ORDER — PANTOPRAZOLE SODIUM 40 MG PO TBEC: 40.0000 mg | DELAYED_RELEASE_TABLET | Freq: Every day | ORAL | 1 refills | Status: AC

## 2022-09-29 NOTE — Assessment & Plan Note (Signed)
Pt restarted zoloft 1-2 weeks ago She does not want to start bcp

## 2022-09-29 NOTE — Progress Notes (Signed)
Established Patient Office Visit  Subjective   Patient ID: Madison Hansen, female    DOB: October 16, 2001  Age: 21 y.o. MRN: [id number]  No chief complaint on file.   HPI Pt is here with her mother c/o RUQ pain and midepigastric pain with burping x 4 days.  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  She is not sleeping well and takes no otc meds.  She c/o inc anxiety / depression worse due to stress and time of year.  She restarted her zoloft 2 weeks ago.  She stopped in in August.  She has a counselor she will call her to schedule an appointment .    Patient Active Problem List   Diagnosis Date Noted   Seasonal affective disorder ([place]) 09/29/2022   PMDD (premenstrual dysphoric disorder) 09/29/2022   Midepigastric pain 09/29/2022   RUQ pain 09/29/2022   Idiopathic intracranial hypertension 06/15/2022   Mild intermittent asthma without complication [id number]   Cold sore 02/15/2022   Seasonal allergies 02/15/2022   Preventative health care 02/15/2022   Acne vulgaris 02/15/2022   Past Medical History:  Diagnosis Date   Asthma    Environmental allergies    Past Surgical History:  Procedure Laterality Date   TONSILLECTOMY  2008   Social History   Tobacco Use   Smoking status: Former    Types: E-cigarettes   Smokeless tobacco: Never  Vaping Use   Vaping Use: Former  Substance Use Topics   Alcohol use: No   Drug use: No   Social History   Socioeconomic History   Marital status: Single    Spouse name: Not on file   Number of children: Not on file   Years of education: Not on file   Highest education level: Not on file  Occupational History   Not on file  Tobacco Use   Smoking status: Former    Types: E-cigarettes   Smokeless tobacco: Never  Vaping Use   Vaping Use: Former  Substance and Sexual Activity   Alcohol use: No   Drug use: No   Sexual activity: Not Currently  Other Topics  Concern   Not on file  Social History Narrative   Not on file   Social Determinants of Health   Financial Resource Strain: Not on file  Food Insecurity: Not on file  Transportation Needs: Not on file  Physical Activity: Not on file  Stress: Not on file  Social Connections: Not on file  Intimate Partner Violence: Not on file   No family status information on file.   History reviewed. No pertinent family history. Allergies  Allergen Reactions   Other     Lima Beans: Unknown      Review of Systems  Constitutional:  Negative for chills, fever and malaise/fatigue.  HENT:  Negative for congestion and hearing loss.   Eyes:  Negative for discharge.  Respiratory:  Negative for cough, sputum production and shortness of breath.   Cardiovascular:  Negative for chest pain, palpitations and leg swelling.  Gastrointestinal:  Negative for abdominal pain, blood in stool, constipation, diarrhea, heartburn, nausea and vomiting.  Genitourinary:  Negative for dysuria, frequency, hematuria and urgency.  Musculoskeletal:  Negative for back pain, falls and myalgias.  Skin:  Negative for rash.  Neurological:  Negative for dizziness, sensory change, loss of consciousness, weakness and headaches.  Endo/Heme/Allergies:  Negative for environmental allergies. Does not bruise/bleed  easily.  Psychiatric/Behavioral:  Negative for depression and suicidal ideas. The patient is not nervous/anxious and does not have insomnia.    Review of Systems  Constitutional: Negative for activity change, appetite change +fatigue   HENT: Negative for hearing loss, congestion, tinnitus and ear discharge.  dentist q37m Eyes: Negative for visual disturbance   Respiratory: Negative for cough, chest tightness and shortness of breath.   Cardiovascular: Negative for chest pain, palpitations and leg swelling.  Gastrointestinal: Negative for  diarrhea, constipation +abd pain  Genitourinary: Negative for urgency, frequency,  decreased urine volume and difficulty urinating.  Musculoskeletal: Negative  arthralgias and gait problem.  + r flank pain Skin: Negative for color change, pallor and rash.  Neurological: Negative for dizziness, light-headedness, numbness and headaches.  Hematological: Negative for adenopathy. Does not bruise/bleed easily.  Psychiatric/Behavioral: + depression and not sleeping       Objective:     BP (!) 120/90   Pulse (!) 54   Temp 98 F (36.7 C) (Oral)   Wt 191 lb (86.6 kg)   BMI 32.77 kg/m  BP Readings from Last 3 Encounters:  09/29/22 (!) 120/90  06/15/22 112/70  02/02/22 102/78   Wt Readings from Last 3 Encounters:  09/29/22 191 lb (86.6 kg) (96 %, Z= 1.80)*  06/15/22 192 lb 3.2 oz (87.2 kg) (97 %, Z= 1.83)*  02/02/22 179 lb 12.8 oz (81.6 kg) (95 %, Z= 1.62)*   * Growth percentiles are based on CDC (Girls, 2-20 Years) data.   SpO2 Readings from Last 3 Encounters:  06/15/22 100%  02/02/22 99%  09/24/18 98%      Physical Exam Vitals and nursing note reviewed.  Constitutional:      General: She is not in acute distress.    Appearance: She is well-developed and normal weight. She is not ill-appearing.  HENT:     Head: Normocephalic and atraumatic.     Mouth/Throat:     Mouth: Mucous membranes are moist.     Pharynx: Oropharynx is clear.  Eyes:     Conjunctiva/sclera: Conjunctivae normal.  Neck:     Thyroid: No thyromegaly.     Vascular: No carotid bruit or JVD.  Cardiovascular:     Rate and Rhythm: Normal rate and regular rhythm.     Heart sounds: Normal heart sounds. No murmur heard. Pulmonary:     Effort: Pulmonary effort is normal. No respiratory distress.     Breath sounds: Normal breath sounds. No wheezing or rales.  Chest:     Chest wall: No tenderness.  Abdominal:     General: There is no distension.     Tenderness: There is abdominal tenderness in the right upper quadrant and epigastric area. There is right CVA tenderness. There is no left CVA  tenderness, guarding or rebound. Negative signs include Murphy's sign, Rovsing's sign, McBurney's sign, psoas sign and obturator sign.  Musculoskeletal:     Cervical back: Normal range of motion and neck supple.  Neurological:     Mental Status: She is alert and oriented to person, place, and time.     Results for orders placed or performed in visit on 09/29/22  POCT Urinalysis Dipstick (Automated)  Result Value Ref Range   Color, UA yellow    Clarity, UA clear    Glucose, UA Negative Negative   Bilirubin, UA negative    Ketones, UA negative    Spec Grav, UA 1.010 1.010 - 1.025   Blood, UA moderate    pH, UA 7.5  5.0 - 8.0   Protein, UA Negative Negative   Urobilinogen, UA 0.2 0.2 or 1.0 E.U./dL   Nitrite, UA negative    Leukocytes, UA Negative Negative  POCT urine pregnancy  Result Value Ref Range   Preg Test, Ur Negative Negative    Last CBC Lab Results  Component Value Date   WBC 5.0 06/15/2022   HGB 12.7 06/15/2022   HCT 38.3 06/15/2022   MCV 89.2 06/15/2022   MCH 27.6 09/24/2018   RDW 14.2 06/15/2022   PLT 258.0 0000000   Last metabolic panel Lab Results  Component Value Date   GLUCOSE 60 (L) 06/15/2022   NA 138 06/15/2022   K 3.8 06/15/2022   CL 111 06/15/2022   CO2 17 (L) 06/15/2022   BUN 8 06/15/2022   CREATININE 0.95 06/15/2022   GFRNONAA NOT CALCULATED 09/24/2018   CALCIUM 8.5 06/15/2022   PROT 6.9 06/15/2022   ALBUMIN 4.3 06/15/2022   BILITOT 0.4 06/15/2022   ALKPHOS 43 (L) 06/15/2022   AST 26 06/15/2022   ALT 16 06/15/2022   ANIONGAP 9 09/24/2018   Last lipids Lab Results  Component Value Date   CHOL 120 06/15/2022   HDL 39.70 06/15/2022   LDLCALC 67 06/15/2022   TRIG 66.0 06/15/2022   CHOLHDL 3 06/15/2022   Last hemoglobin A1c No results found for: "HGBA1C" Last thyroid functions Lab Results  Component Value Date   TSH 1.35 06/15/2022   Last vitamin D Lab Results  Component Value Date   VD25OH 17.71 (L) 06/15/2022   Last  vitamin B12 and Folate Lab Results  Component Value Date   VITAMINB12 330 06/15/2022      The ASCVD Risk score (Arnett DK, et al., 2019) failed to calculate for the following reasons:   The 2019 ASCVD risk score is only valid for ages 84 to 29    Assessment & Plan:   Problem List Items Addressed This Visit       Unprioritized   Seasonal affective disorder ([place])    Con't zoloft  F/u in 1 month Or sooner prn F/u counselor  Pt denies suicidal ideation       RUQ pain - Primary    Check [place] abd  Check labs  Consider gi referral        Relevant Orders   POCT Urinalysis Dipstick (Automated) (Completed)   POCT urine pregnancy (Completed)   US Abdomen Complete   CBC with Differential/Platelet   Comprehensive metabolic panel   TSH   Vitamin B12   VITAMIN D 25 Hydroxy (Vit-D Deficiency, Fractures)   PMDD (premenstrual dysphoric disorder)    Pt restarted zoloft 1-2 weeks ago She does not want to start bcp      Midepigastric pain    Start protonix daily  Check labs and discussed foods to avoid        Relevant Medications   pantoprazole (PROTONIX) 40 MG tablet   Other Relevant Orders   POCT Urinalysis Dipstick (Automated) (Completed)   POCT urine pregnancy (Completed)   US Abdomen Complete   CBC with Differential/Platelet   Comprehensive metabolic panel   TSH   Vitamin B12   VITAMIN D 25 Hydroxy (Vit-D Deficiency, Fractures)   H. pylori breath test   Other Visit Diagnoses     Other fatigue       Relevant Orders   Epstein-Barr virus VCA antibody panel       Return in about 4 weeks (around 10/27/2022), or if symptoms worsen or fail  to improve, for depression.    Ann Held, DO

## 2022-09-29 NOTE — Patient Instructions (Signed)

## 2022-09-29 NOTE — Assessment & Plan Note (Signed)
Start protonix daily  Check labs and discussed foods to avoid

## 2022-09-29 NOTE — Assessment & Plan Note (Signed)
Con't zoloft  F/u in 1 month Or sooner prn F/u counselor  Pt denies suicidal ideation

## 2022-09-29 NOTE — Assessment & Plan Note (Signed)
Check [place] abd  Check labs  Consider gi referral

## 2022-09-30 NOTE — Telephone Encounter (Signed)
Received message from lowne to call patient to get her scheduled. Patient VM was full and could not leave voicemail. Tricia sent message below, I resent a message to her to call and schedule appt

## 2022-10-27 ENCOUNTER — Telehealth (HOSPITAL_BASED_OUTPATIENT_CLINIC_OR_DEPARTMENT_OTHER): Payer: Self-pay

## 2022-11-10 ENCOUNTER — Encounter: Payer: Self-pay | Admitting: Family Medicine

## 2022-11-10 ENCOUNTER — Ambulatory Visit: Payer: BC Managed Care – PPO | Admitting: Family Medicine

## 2022-11-10 VITALS — BP 110/80 | HR 72 | Temp 98.3°F | Resp 18 | Ht 64.02 in | Wt 188.2 lb

## 2022-11-10 DIAGNOSIS — K611 Rectal abscess: Secondary | ICD-10-CM | POA: Diagnosis not present

## 2022-11-10 MED ORDER — SULFAMETHOXAZOLE-TRIMETHOPRIM 800-160 MG PO TABS: 1.0000 | ORAL_TABLET | Freq: Two times a day (BID) | ORAL | 0 refills | Status: DC

## 2022-11-10 NOTE — Assessment & Plan Note (Signed)
Abx , epson salt bath , compresses  If it does not resolve with abx , return to office or call for referral

## 2022-11-10 NOTE — Progress Notes (Signed)
Subjective:   By signing my name below, I, Madison Hansen, attest that this documentation has been prepared under the direction and in the presence of Roma Schanz R. DO 11/10/2022   Patient ID: Madison Hansen, female    DOB: 09-28-2001, 22 y.o.   MRN: [id number]  Chief Complaint  Patient presents with   Mass    X3 days ago, painful, no discharge, right glute.    HPI Patient is in today for the evaluation of a bump on her right buttock.  She states she has what she described as a boil on the inside of her right buttock and has been experiencing pain in the area for the last 3 days, though she stated it may have formed earlier. She denies any drainage. She has attempted to squeeze it but nothing came out.   She has an allergy to tetracycline (and related) with hypertension as a reaction.   Over the summer she had frequent yeast infections.  She denies having any fever, new muscle pain, new joint pain, new moles, congestion, sinus pain, sore throat, chest pain, palpitations, cough, SOB, wheezing, n/v/d, constipation, blood in stool, dysuria, frequency, hematuria, or headaches at this time.   Past Medical History:  Diagnosis Date   Asthma    Environmental allergies     Past Surgical History:  Procedure Laterality Date   TONSILLECTOMY  2008    No family history on file.  Social History   Socioeconomic History   Marital status: Single    Spouse name: Not on file   Number of children: Not on file   Years of education: Not on file   Highest education level: Not on file  Occupational History   Not on file  Tobacco Use   Smoking status: Former    Types: E-cigarettes   Smokeless tobacco: Never  Vaping Use   Vaping Use: Former  Substance and Sexual Activity   Alcohol use: No   Drug use: No   Sexual activity: Not Currently  Other Topics Concern   Not on file  Social History Narrative   Not on file   Social Determinants of Health   Financial Resource  Strain: Not on file  Food Insecurity: Not on file  Transportation Needs: Not on file  Physical Activity: Not on file  Stress: Not on file  Social Connections: Not on file  Intimate Partner Violence: Not on file    Outpatient Medications Prior to Visit  Medication Sig Dispense Refill   albuterol (VENTOLIN HFA) 108 (90 Base) MCG/ACT inhaler Inhale 2 puffs into the lungs every 6 (six) hours as needed. For shortness of breath 1 each 5   beclomethasone (QVAR) 40 MCG/ACT inhaler Inhale 2 puffs into the lungs 2 (two) times daily.     Budesonide (PULMICORT FLEXHALER) 90 MCG/ACT inhaler Inhale 1 puff into the lungs 2 (two) times daily. 1 each 5   ergocalciferol (VITAMIN D2) 1.25 MG (50000 UT) capsule Take 1 capsule (50,000 Units total) by mouth once a week. 12 capsule 1   fluconazole (DIFLUCAN) 150 MG tablet 1 po x1, may repeat in 3 days prn 2 tablet 0   fluticasone (FLONASE) 50 MCG/ACT nasal spray Place 2 sprays into both nostrils daily. 18.2 mL 5   ibuprofen (ADVIL,MOTRIN) 100 MG chewable tablet Chew 200 mg by mouth every 8 (eight) hours as needed. For fever     loratadine (CLARITIN) 5 MG chewable tablet Chew 5 mg by mouth daily.     minocycline (  DYNACIN) 50 MG tablet Take 1 tablet (50 mg total) by mouth 2 (two) times daily. 60 tablet 3   pantoprazole (PROTONIX) 40 MG tablet Take 1 tablet (40 mg total) by mouth daily. 90 tablet 1   sertraline (ZOLOFT) 50 MG tablet TAKE 1 TABLET BY MOUTH EVERY DAY 90 tablet 1   No facility-administered medications prior to visit.    Allergies  Allergen Reactions   Tetracyclines & Related Hypertension   Other     Lima Beans: Unknown    Review of Systems  Constitutional:  Negative for fever and malaise/fatigue.  HENT:  Negative for congestion.   Eyes:  Negative for blurred vision.  Respiratory:  Negative for shortness of breath.   Cardiovascular:  Negative for chest pain, palpitations and leg swelling.  Gastrointestinal:  Negative for abdominal pain,  blood in stool and nausea.  Genitourinary:  Negative for dysuria and frequency.  Musculoskeletal:  Negative for falls.  Skin:  Negative for rash.  Neurological:  Negative for dizziness, loss of consciousness and headaches.  Endo/Heme/Allergies:  Negative for environmental allergies.  Psychiatric/Behavioral:  Negative for depression. The patient is not nervous/anxious.        Objective:    Physical Exam Vitals and nursing note reviewed.  Constitutional:      General: She is not in acute distress.    Appearance: Normal appearance.  HENT:     Head: Normocephalic and atraumatic.     Right Ear: External ear normal.     Left Ear: External ear normal.  Eyes:     Extraocular Movements: Extraocular movements intact.     Pupils: Pupils are equal, round, and reactive to light.  Cardiovascular:     Rate and Rhythm: Normal rate and regular rhythm.     Pulses: Normal pulses.     Heart sounds: Normal heart sounds. No murmur heard.    No gallop.  Pulmonary:     Effort: Pulmonary effort is normal. No respiratory distress.     Breath sounds: Normal breath sounds. No wheezing.  Skin:    General: Skin is warm and dry.     Findings: Abscess (perirectal abscess) and lesion present.     Comments: + tender mass r side perirectal  Min drainage   Neurological:     Mental Status: She is alert and oriented to person, place, and time.  Psychiatric:        Judgment: Judgment normal.     BP 110/80 (BP Location: Left Arm, Patient Position: Sitting, Cuff Size: Normal)   Pulse 72   Temp 98.3 F (36.8 C) (Oral)   Resp 18   Ht 5' 4.02" (1.626 m)   Wt 188 lb 3.2 oz (85.4 kg)   SpO2 98%   BMI 32.28 kg/m  Wt Readings from Last 3 Encounters:  11/10/22 188 lb 3.2 oz (85.4 kg) (96 %, Z= 1.75)*  09/29/22 191 lb (86.6 kg) (96 %, Z= 1.80)*  06/15/22 192 lb 3.2 oz (87.2 kg) (97 %, Z= 1.83)*   * Growth percentiles are based on CDC (Girls, 2-20 Years) data.    Diabetic Foot Exam - Simple   No data  filed    Lab Results  Component Value Date   WBC 5.0 06/15/2022   HGB 12.7 06/15/2022   HCT 38.3 06/15/2022   PLT 258.0 06/15/2022   GLUCOSE 60 (L) 06/15/2022   CHOL 120 06/15/2022   TRIG 66.0 06/15/2022   HDL 39.70 06/15/2022   LDLCALC 67 06/15/2022   ALT 16  06/15/2022   AST 26 06/15/2022   NA 138 06/15/2022   K 3.8 06/15/2022   CL 111 06/15/2022   CREATININE 0.95 06/15/2022   BUN 8 06/15/2022   CO2 17 (L) 06/15/2022   TSH 1.35 06/15/2022    Lab Results  Component Value Date   TSH 1.35 06/15/2022   Lab Results  Component Value Date   WBC 5.0 06/15/2022   HGB 12.7 06/15/2022   HCT 38.3 06/15/2022   MCV 89.2 06/15/2022   PLT 258.0 06/15/2022   Lab Results  Component Value Date   NA 138 06/15/2022   K 3.8 06/15/2022   CO2 17 (L) 06/15/2022   GLUCOSE 60 (L) 06/15/2022   BUN 8 06/15/2022   CREATININE 0.95 06/15/2022   BILITOT 0.4 06/15/2022   ALKPHOS 43 (L) 06/15/2022   AST 26 06/15/2022   ALT 16 06/15/2022   PROT 6.9 06/15/2022   ALBUMIN 4.3 06/15/2022   CALCIUM 8.5 06/15/2022   ANIONGAP 9 09/24/2018   GFR 86.88 06/15/2022   Lab Results  Component Value Date   CHOL 120 06/15/2022   Lab Results  Component Value Date   HDL 39.70 06/15/2022   Lab Results  Component Value Date   LDLCALC 67 06/15/2022   Lab Results  Component Value Date   TRIG 66.0 06/15/2022   Lab Results  Component Value Date   CHOLHDL 3 06/15/2022   No results found for: "HGBA1C"     Assessment & Plan:   Problem List Items Addressed This Visit       Unprioritized   Abscess, perirectal - Primary    Abx , epson salt bath , compresses  If it does not resolve with abx , return to office or call for referral       Relevant Medications   sulfamethoxazole-trimethoprim (BACTRIM DS) 800-160 MG tablet     Meds ordered this encounter  Medications   sulfamethoxazole-trimethoprim (BACTRIM DS) 800-160 MG tablet    Sig: Take 1 tablet by mouth 2 (two) times daily.     Dispense:  20 tablet    Refill:  0    I, [name], DO, personally preformed the services described in this documentation.  All medical record entries made by the scribe were at my direction and in my presence.  I have reviewed the chart and discharge instructions (if applicable) and agree that the record reflects my personal performance and is accurate and complete. 11/10/22   I,Rachel Rivera,acting as a scribe for [name], DO.,have documented all relevant documentation on the behalf of [name], DO,as directed by  [name], DO while in the presence of [name], DO.   [name], DO

## 2022-12-22 ENCOUNTER — Ambulatory Visit: Payer: BC Managed Care – PPO | Admitting: Family Medicine

## 2022-12-22 ENCOUNTER — Encounter: Payer: Self-pay | Admitting: Family Medicine

## 2022-12-22 VITALS — BP 118/78 | HR 67 | Temp 98.7°F | Resp 16 | Ht 64.02 in | Wt 191.8 lb

## 2022-12-22 DIAGNOSIS — L720 Epidermal cyst: Secondary | ICD-10-CM

## 2022-12-22 DIAGNOSIS — J0140 Acute pansinusitis, unspecified: Secondary | ICD-10-CM

## 2022-12-22 MED ORDER — LEVOCETIRIZINE DIHYDROCHLORIDE 5 MG PO TABS: 5.0000 mg | ORAL_TABLET | Freq: Every evening | ORAL | 5 refills | Status: AC

## 2022-12-22 MED ORDER — FLUTICASONE PROPIONATE 50 MCG/ACT NA SUSP: 2.0000 | Freq: Every day | NASAL | 6 refills | Status: AC

## 2022-12-22 MED ORDER — CEFUROXIME AXETIL 500 MG PO TABS: 500.0000 mg | ORAL_TABLET | Freq: Two times a day (BID) | ORAL | 0 refills | Status: AC

## 2022-12-22 NOTE — Progress Notes (Addendum)
Subjective:   By signing my name below, I, [name], attest that this documentation has been prepared under the direction and in the presence of Ann Held, DO. 12/22/2022   Patient ID: Madison Hansen, female    DOB: [birth year], 22 y.o.   MRN: [id number]  Chief Complaint  Patient presents with   Mass    Pt states noticing a bump in the back of her head. She scratched it and it started bleeding. Bleeding has stopped.   Sinus Problem    X2 days, pt states having SOB, chest pain, sinus pressure. NO COVID test or OTC meds    Sinus Problem Associated symptoms include congestion, shortness of breath (mild) and a sore throat (mild). Pertinent negatives include no coughing or headaches.   Patient is in today for a office visit.   She complains of a lesion on her head that started bleeding after she scratched it. She notes it is scabbing over at this time.  She also complains of congestion, sinus pressure, and mild sore throat. She is also having mild difficulty breathing. She denies having any fevers or cough at this time. She is not taking any medications to manage her symptoms.    Past Medical History:  Diagnosis Date   Asthma    Environmental allergies     Past Surgical History:  Procedure Laterality Date   TONSILLECTOMY  2008    History reviewed. No pertinent family history.  Social History   Socioeconomic History   Marital status: Single    Spouse name: Not on file   Number of children: Not on file   Years of education: Not on file   Highest education level: Not on file  Occupational History   Not on file  Tobacco Use   Smoking status: Former    Types: E-cigarettes   Smokeless tobacco: Never  Vaping Use   Vaping Use: Former  Substance and Sexual Activity   Alcohol use: No   Drug use: No   Sexual activity: Not Currently  Other Topics Concern   Not on file  Social History Narrative   Not on file   Social Determinants of Health    Financial Resource Strain: Not on file  Food Insecurity: Not on file  Transportation Needs: Not on file  Physical Activity: Not on file  Stress: Not on file  Social Connections: Not on file  Intimate Partner Violence: Not on file    Outpatient Medications Prior to Visit  Medication Sig Dispense Refill   albuterol (VENTOLIN HFA) 108 (90 Base) MCG/ACT inhaler Inhale 2 puffs into the lungs every 6 (six) hours as needed. For shortness of breath 1 each 5   beclomethasone (QVAR) 40 MCG/ACT inhaler Inhale 2 puffs into the lungs 2 (two) times daily.     Budesonide (PULMICORT FLEXHALER) 90 MCG/ACT inhaler Inhale 1 puff into the lungs 2 (two) times daily. 1 each 5   ergocalciferol (VITAMIN D2) 1.25 MG (50000 UT) capsule Take 1 capsule (50,000 Units total) by mouth once a week. 12 capsule 1   fluticasone (FLONASE) 50 MCG/ACT nasal spray Place 2 sprays into both nostrils daily. 18.2 mL 5   ibuprofen (ADVIL,MOTRIN) 100 MG chewable tablet Chew 200 mg by mouth every 8 (eight) hours as needed. For fever     loratadine (CLARITIN) 5 MG chewable tablet Chew 5 mg by mouth daily.     pantoprazole (PROTONIX) 40 MG tablet Take 1 tablet (40 mg total) by mouth daily. 90 tablet  1   sertraline (ZOLOFT) 50 MG tablet TAKE 1 TABLET BY MOUTH EVERY DAY 90 tablet 1   fluconazole (DIFLUCAN) 150 MG tablet 1 po x1, may repeat in 3 days prn (Patient not taking: Reported on 12/22/2022) 2 tablet 0   minocycline (DYNACIN) 50 MG tablet Take 1 tablet (50 mg total) by mouth 2 (two) times daily. (Patient not taking: Reported on 12/22/2022) 60 tablet 3   sulfamethoxazole-trimethoprim (BACTRIM DS) 800-160 MG tablet Take 1 tablet by mouth 2 (two) times daily. (Patient not taking: Reported on 12/22/2022) 20 tablet 0   No facility-administered medications prior to visit.    Allergies  Allergen Reactions   Tetracyclines & Related Hypertension   Other     Lima Beans: Unknown    Review of Systems  Constitutional:  Negative for  fever and malaise/fatigue.  HENT:  Positive for congestion, sinus pain and sore throat (mild).        (+)sinus pressure  Eyes:  Negative for blurred vision.  Respiratory:  Positive for shortness of breath (mild). Negative for cough.   Cardiovascular:  Negative for chest pain, palpitations and leg swelling.  Gastrointestinal:  Negative for abdominal pain, blood in stool and nausea.  Genitourinary:  Negative for dysuria and frequency.  Musculoskeletal:  Negative for falls.  Skin:  Negative for rash.       (+)lesion on scalp  Neurological:  Negative for dizziness, loss of consciousness and headaches.  Endo/Heme/Allergies:  Negative for environmental allergies.  Psychiatric/Behavioral:  Negative for depression. The patient is not nervous/anxious.        Objective:    Physical Exam Vitals and nursing note reviewed.  Constitutional:      General: She is not in acute distress.    Appearance: Normal appearance. She is not ill-appearing.  HENT:     Head: Normocephalic and atraumatic.     Right Ear: Tympanic membrane, ear canal and external ear normal.     Left Ear: Tympanic membrane, ear canal and external ear normal.     Nose:     Right Sinus: Maxillary sinus tenderness and frontal sinus tenderness present.     Left Sinus: Maxillary sinus tenderness and frontal sinus tenderness present.     Mouth/Throat:     Mouth: Mucous membranes are moist.     Pharynx: Oropharynx is clear. No oropharyngeal exudate or posterior oropharyngeal erythema.  Eyes:     Extraocular Movements: Extraocular movements intact.     Pupils: Pupils are equal, round, and reactive to light.  Cardiovascular:     Rate and Rhythm: Normal rate and regular rhythm.     Heart sounds: Normal heart sounds. No murmur heard.    No gallop.  Pulmonary:     Effort: Pulmonary effort is normal. No respiratory distress.     Breath sounds: Normal breath sounds. No wheezing or rales.     Comments: She tested negative for COVID-19  and Flu in office Lymphadenopathy:     Cervical: Cervical adenopathy present.  Skin:    General: Skin is warm and dry.  Neurological:     Mental Status: She is alert and oriented to person, place, and time.  Psychiatric:        Judgment: Judgment normal.     BP 118/78 (BP Location: Left Arm, Patient Position: Sitting, Cuff Size: Normal)   Pulse 67   Temp 98.7 F (37.1 C) (Oral)   Resp 16   Ht 5' 4.02" (1.626 m)   Wt 191 lb 12.8 oz (  87 kg)   SpO2 98%   BMI 32.90 kg/m  Wt Readings from Last 3 Encounters:  12/22/22 191 lb 12.8 oz (87 kg) (96 %, Z= 1.81)*  11/10/22 188 lb 3.2 oz (85.4 kg) (96 %, Z= 1.75)*  09/29/22 191 lb (86.6 kg) (96 %, Z= 1.80)*   * Growth percentiles are based on CDC (Girls, 2-20 Years) data.       Assessment & Plan:  Acute non-recurrent pansinusitis -     Cefuroxime Axetil; Take 1 tablet (500 mg total) by mouth 2 (two) times daily with a meal for 10 days.  Dispense: 20 tablet; Refill: 0 -     Fluticasone Propionate; Place 2 sprays into both nostrils daily.  Dispense: 16 g; Refill: 6 -     Levocetirizine Dihydrochloride; Take 1 tablet (5 mg total) by mouth every evening.  Dispense: 30 tablet; Refill: 5  Epidermoid cyst    I, Ann Held, DO, personally preformed the services described in this documentation.  All medical record entries made by the scribe were at my direction and in my presence.  I have reviewed the chart and discharge instructions (if applicable) and agree that the record reflects my personal performance and is accurate and complete. 12/22/2022   I,[name],acting as a scribe for Ann Held, DO.,have documented all relevant documentation on the behalf of Ann Held, DO,as directed by  Ann Held, DO while in the presence of Ann Held, DO.   Ann Held, DO

## 2022-12-22 NOTE — Patient Instructions (Signed)

## 2023-02-04 ENCOUNTER — Encounter: Payer: BC Managed Care – PPO | Admitting: Family Medicine

## 2023-02-04 ENCOUNTER — Other Ambulatory Visit: Payer: Self-pay | Admitting: Family Medicine

## 2023-02-15 ENCOUNTER — Other Ambulatory Visit: Payer: Self-pay | Admitting: Family Medicine

## 2023-02-15 ENCOUNTER — Encounter: Payer: Self-pay | Admitting: Family Medicine

## 2023-02-15 ENCOUNTER — Ambulatory Visit (INDEPENDENT_AMBULATORY_CARE_PROVIDER_SITE_OTHER): Payer: BC Managed Care – PPO | Admitting: Family Medicine

## 2023-02-15 VITALS — BP 110/78 | HR 64 | Temp 98.7°F | Resp 12 | Ht 64.0 in | Wt 193.6 lb

## 2023-02-15 DIAGNOSIS — J4521 Mild intermittent asthma with (acute) exacerbation: Secondary | ICD-10-CM

## 2023-02-15 DIAGNOSIS — J302 Other seasonal allergic rhinitis: Secondary | ICD-10-CM

## 2023-02-15 DIAGNOSIS — J4520 Mild intermittent asthma, uncomplicated: Secondary | ICD-10-CM

## 2023-02-15 MED ORDER — FLUCONAZOLE 150 MG PO TABS: 150.0000 mg | ORAL_TABLET | Freq: Every day | ORAL | 0 refills | Status: DC

## 2023-02-15 MED ORDER — ALBUTEROL SULFATE HFA 108 (90 BASE) MCG/ACT IN AERS: 2.0000 | INHALATION_SPRAY | Freq: Four times a day (QID) | RESPIRATORY_TRACT | 5 refills | Status: AC | PRN

## 2023-02-15 MED ORDER — PREDNISONE 10 MG PO TABS: ORAL_TABLET | ORAL | 0 refills | Status: DC

## 2023-02-15 MED ORDER — QVAR REDIHALER 80 MCG/ACT IN AERB: 2.0000 | INHALATION_SPRAY | Freq: Two times a day (BID) | RESPIRATORY_TRACT | 5 refills | Status: DC

## 2023-02-15 MED ORDER — AZITHROMYCIN 250 MG PO TABS: ORAL_TABLET | ORAL | 0 refills | Status: DC

## 2023-02-15 NOTE — Progress Notes (Signed)
Subjective:   By signing my name below, I, Madison Hansen, attest that this documentation has been prepared under the direction and in the presence of Seabron Spates R, DO. 02/15/2023.   Patient ID: Madison Hansen, female    DOB: Mar 14, 2001, 22 y.o.   MRN: [id number]  Chief Complaint  Patient presents with   Cough    HPI Patient is in today for an office visit.  Cough:  She complains of a persistent cough for the last 2 to 2.5 weeks, productive with phlegm. Her cough does not wake her up at night. She has associated feelings of pressure and tightness in her chest. She denies any fever or sore throat. For treatment she was taking DayQuil but finished it. Of note, she had recently attended a festival at her school. She has not been tested for Covid-19.   Asthma:  Lately she has experienced more frequent asthma attacks. She has been using her Qvar and albuterol more frequently as well. However, she complains of drowsiness as a side effect of her Qvar. Additionally she becomes easily winded with going upstairs, and develops coughing fits when she runs.   Past Medical History:  Diagnosis Date   Asthma    Environmental allergies     Past Surgical History:  Procedure Laterality Date   TONSILLECTOMY  2008    No family history on file.  Social History   Socioeconomic History   Marital status: Single    Spouse name: Not on file   Number of children: Not on file   Years of education: Not on file   Highest education level: Not on file  Occupational History   Not on file  Tobacco Use   Smoking status: Former    Types: E-cigarettes   Smokeless tobacco: Never  Vaping Use   Vaping Use: Former  Substance and Sexual Activity   Alcohol use: No   Drug use: No   Sexual activity: Not Currently  Other Topics Concern   Not on file  Social History Narrative   Not on file   Social Determinants of Health   Financial Resource Strain: Not on file  Food Insecurity: Not on file   Transportation Needs: Not on file  Physical Activity: Not on file  Stress: Not on file  Social Connections: Not on file  Intimate Partner Violence: Not on file    Outpatient Medications Prior to Visit  Medication Sig Dispense Refill   beclomethasone (QVAR) 40 MCG/ACT inhaler Inhale 2 puffs into the lungs 2 (two) times daily.     ergocalciferol (VITAMIN D2) 1.25 MG (50000 UT) capsule Take 1 capsule (50,000 Units total) by mouth once a week. 12 capsule 1   fluticasone (FLONASE) 50 MCG/ACT nasal spray Place 2 sprays into both nostrils daily. 16 g 6   ibuprofen (ADVIL,MOTRIN) 100 MG chewable tablet Chew 200 mg by mouth every 8 (eight) hours as needed. For fever     levocetirizine (XYZAL) 5 MG tablet Take 1 tablet (5 mg total) by mouth every evening. 30 tablet 5   loratadine (CLARITIN) 5 MG chewable tablet Chew 5 mg by mouth daily.     pantoprazole (PROTONIX) 40 MG tablet Take 1 tablet (40 mg total) by mouth daily. 90 tablet 1   sertraline (ZOLOFT) 50 MG tablet Take 1 tablet (50 mg total) by mouth daily. 90 tablet 1   albuterol (VENTOLIN HFA) 108 (90 Base) MCG/ACT inhaler Inhale 2 puffs into the lungs every 6 (six) hours as needed. For  shortness of breath 1 each 5   Budesonide (PULMICORT FLEXHALER) 90 MCG/ACT inhaler Inhale 1 puff into the lungs 2 (two) times daily. 1 each 5   fluticasone (FLONASE) 50 MCG/ACT nasal spray Place 2 sprays into both nostrils daily. 18.2 mL 5   No facility-administered medications prior to visit.    Allergies  Allergen Reactions   Tetracyclines & Related Hypertension   Other     Lima Beans: Unknown    Review of Systems  Constitutional:  Negative for fever and malaise/fatigue.  HENT:  Negative for congestion.   Eyes:  Negative for blurred vision.  Respiratory:  Positive for cough, sputum production and shortness of breath.   Cardiovascular:  Negative for chest pain, palpitations and leg swelling.       +Chest pressure/tightness  Gastrointestinal:   Negative for abdominal pain, blood in stool and nausea.  Genitourinary:  Negative for dysuria and frequency.  Musculoskeletal:  Negative for falls.  Skin:  Negative for rash.  Neurological:  Negative for dizziness, loss of consciousness and headaches.  Endo/Heme/Allergies:  Negative for environmental allergies.  Psychiatric/Behavioral:  Negative for depression. The patient is not nervous/anxious.   See HPI.     Objective:    Physical Exam Constitutional:      Appearance: Normal appearance.  HENT:     Head: Normocephalic and atraumatic.     Right Ear: Tympanic membrane, ear canal and external ear normal.     Left Ear: Tympanic membrane, ear canal and external ear normal.     Mouth/Throat:     Mouth: Mucous membranes are moist.     Pharynx: Oropharynx is clear. No oropharyngeal exudate or posterior oropharyngeal erythema.  Eyes:     Extraocular Movements: Extraocular movements intact.     Pupils: Pupils are equal, round, and reactive to light.  Cardiovascular:     Rate and Rhythm: Normal rate and regular rhythm.     Heart sounds: Normal heart sounds. No murmur heard.    No gallop.  Pulmonary:     Effort: Pulmonary effort is normal. No respiratory distress.     Breath sounds: Decreased breath sounds present. No wheezing or rales.     Comments: Decreased breath sounds throughout lungs bilaterally. Skin:    General: Skin is warm and dry.  Neurological:     General: No focal deficit present.     Mental Status: She is alert and oriented to person, place, and time.  Psychiatric:        Mood and Affect: Mood normal.        Behavior: Behavior normal.     BP 110/78 (BP Location: Right Arm, Cuff Size: Normal)   Pulse 64   Temp 98.7 F (37.1 C) (Oral)   Resp 12   Ht 5\' 4"  (1.626 m)   Wt 193 lb 9.6 oz (87.8 kg)   LMP 02/07/2023   SpO2 97%   BMI 33.23 kg/m  Wt Readings from Last 3 Encounters:  02/15/23 193 lb 9.6 oz (87.8 kg)  12/22/22 191 lb 12.8 oz (87 kg) (96 %, Z= 1.81)*   11/10/22 188 lb 3.2 oz (85.4 kg) (96 %, Z= 1.75)*   * Growth percentiles are based on CDC (Girls, 2-20 Years) data.    Diabetic Foot Exam - Simple   No data filed    Lab Results  Component Value Date   WBC 5.0 06/15/2022   HGB 12.7 06/15/2022   HCT 38.3 06/15/2022   PLT 258.0 06/15/2022   GLUCOSE 60 (  L) 06/15/2022   CHOL 120 06/15/2022   TRIG 66.0 06/15/2022   HDL 39.70 06/15/2022   LDLCALC 67 06/15/2022   ALT 16 06/15/2022   AST 26 06/15/2022   NA 138 06/15/2022   K 3.8 06/15/2022   CL 111 06/15/2022   CREATININE 0.95 06/15/2022   BUN 8 06/15/2022   CO2 17 (L) 06/15/2022   TSH 1.35 06/15/2022    Lab Results  Component Value Date   TSH 1.35 06/15/2022   Lab Results  Component Value Date   WBC 5.0 06/15/2022   HGB 12.7 06/15/2022   HCT 38.3 06/15/2022   MCV 89.2 06/15/2022   PLT 258.0 06/15/2022   Lab Results  Component Value Date   NA 138 06/15/2022   K 3.8 06/15/2022   CO2 17 (L) 06/15/2022   GLUCOSE 60 (L) 06/15/2022   BUN 8 06/15/2022   CREATININE 0.95 06/15/2022   BILITOT 0.4 06/15/2022   ALKPHOS 43 (L) 06/15/2022   AST 26 06/15/2022   ALT 16 06/15/2022   PROT 6.9 06/15/2022   ALBUMIN 4.3 06/15/2022   CALCIUM 8.5 06/15/2022   ANIONGAP 9 09/24/2018   GFR 86.88 06/15/2022   Lab Results  Component Value Date   CHOL 120 06/15/2022   Lab Results  Component Value Date   HDL 39.70 06/15/2022   Lab Results  Component Value Date   LDLCALC 67 06/15/2022   Lab Results  Component Value Date   TRIG 66.0 06/15/2022   Lab Results  Component Value Date   CHOLHDL 3 06/15/2022   No results found for: "HGBA1C"     Assessment & Plan:   Problem List Items Addressed This Visit       Unprioritized   Seasonal allergies   Relevant Medications   albuterol (VENTOLIN HFA) 108 (90 Base) MCG/ACT inhaler   Mild intermittent asthma without complication   Relevant Medications   albuterol (VENTOLIN HFA) 108 (90 Base) MCG/ACT inhaler   beclomethasone  (QVAR REDIHALER) 80 MCG/ACT inhaler   predniSONE (DELTASONE) 10 MG tablet   Other Visit Diagnoses     Bronchitis, asthmatic, mild intermittent, with acute exacerbation    -  Primary   Relevant Medications   azithromycin (ZITHROMAX Z-PAK) 250 MG tablet   albuterol (VENTOLIN HFA) 108 (90 Base) MCG/ACT inhaler   beclomethasone (QVAR REDIHALER) 80 MCG/ACT inhaler   predniSONE (DELTASONE) 10 MG tablet        Meds ordered this encounter  Medications   azithromycin (ZITHROMAX Z-PAK) 250 MG tablet    Sig: As directed    Dispense:  6 each    Refill:  0   albuterol (VENTOLIN HFA) 108 (90 Base) MCG/ACT inhaler    Sig: Inhale 2 puffs into the lungs every 6 (six) hours as needed. For shortness of breath    Dispense:  1 each    Refill:  5   beclomethasone (QVAR REDIHALER) 80 MCG/ACT inhaler    Sig: Inhale 2 puffs into the lungs 2 (two) times daily.    Dispense:  1 each    Refill:  5   predniSONE (DELTASONE) 10 MG tablet    Sig: TAKE 3 TABLETS PO QD FOR 3 DAYS THEN TAKE 2 TABLETS PO QD FOR 3 DAYS THEN TAKE 1 TABLET PO QD FOR 3 DAYS THEN TAKE 1/2 TAB PO QD FOR 3 DAYS    Dispense:  20 tablet    Refill:  0   fluconazole (DIFLUCAN) 150 MG tablet    Sig: Take 1 tablet (  150 mg total) by mouth daily. May repeat in 3 days if needed.    Dispense:  2 tablet    Refill:  0    I, [name], DO, personally preformed the services described in this documentation.  All medical record entries made by the scribe were at my direction and in my presence.  I have reviewed the chart and discharge instructions (if applicable) and agree that the record reflects my personal performance and is accurate and complete. 02/15/2023.  I,Mathew Stumpf,acting as a Neurosurgeon for [employer], DO.,have documented all relevant documentation on the behalf of [name], DO,as directed by  [name], DO while in the presence of [name], DO.   [name], DO

## 2023-02-16 ENCOUNTER — Encounter: Payer: BC Managed Care – PPO | Admitting: Family Medicine

## 2023-02-24 ENCOUNTER — Telehealth: Payer: Self-pay

## 2023-02-24 ENCOUNTER — Other Ambulatory Visit (HOSPITAL_COMMUNITY): Payer: Self-pay

## 2023-02-24 NOTE — Telephone Encounter (Signed)
PA pending, documenting in separate encounter. Please sign off as PA team doesn't have the ability to sign off on rx requests, thank you.

## 2023-02-24 NOTE — Telephone Encounter (Signed)
Patient Advocate Encounter   Received notification from [employer] that prior authorization for Qvar 80 mcg is required.   PA submitted on 02/24/2023 Key [id number] Insurance [employer] Status is pending

## 2023-03-01 NOTE — Telephone Encounter (Signed)
Pharmacy Patient Advocate Encounter  Prior Authorization for QVAR has been approved by [employer] (ins).    PA # [id number] Effective dates: 02/24/2023 through 02/24/2024

## 2023-03-02 ENCOUNTER — Encounter: Payer: Self-pay | Admitting: Family Medicine

## 2023-03-04 ENCOUNTER — Ambulatory Visit (INDEPENDENT_AMBULATORY_CARE_PROVIDER_SITE_OTHER): Payer: BC Managed Care – PPO | Admitting: Family Medicine

## 2023-03-04 ENCOUNTER — Other Ambulatory Visit: Payer: Self-pay | Admitting: Family Medicine

## 2023-03-04 ENCOUNTER — Encounter: Payer: Self-pay | Admitting: Family Medicine

## 2023-03-04 VITALS — BP 110/80 | HR 82 | Temp 98.5°F | Resp 18 | Ht 64.0 in | Wt 200.0 lb

## 2023-03-04 DIAGNOSIS — F3281 Premenstrual dysphoric disorder: Secondary | ICD-10-CM

## 2023-03-04 DIAGNOSIS — J4521 Mild intermittent asthma with (acute) exacerbation: Secondary | ICD-10-CM

## 2023-03-04 DIAGNOSIS — Z0001 Encounter for general adult medical examination with abnormal findings: Secondary | ICD-10-CM

## 2023-03-04 DIAGNOSIS — Z13220 Encounter for screening for lipoid disorders: Secondary | ICD-10-CM | POA: Diagnosis not present

## 2023-03-04 DIAGNOSIS — G932 Benign intracranial hypertension: Secondary | ICD-10-CM

## 2023-03-04 DIAGNOSIS — Z0000 Encounter for general adult medical examination without abnormal findings: Secondary | ICD-10-CM

## 2023-03-04 DIAGNOSIS — F418 Other specified anxiety disorders: Secondary | ICD-10-CM

## 2023-03-04 MED ORDER — ESCITALOPRAM OXALATE 10 MG PO TABS
10.0000 mg | ORAL_TABLET | Freq: Every day | ORAL | 2 refills | Status: DC
Start: 2023-03-04 — End: 2023-03-26

## 2023-03-04 MED ORDER — AZITHROMYCIN 250 MG PO TABS: ORAL_TABLET | ORAL | 0 refills | Status: DC

## 2023-03-04 MED ORDER — QVAR REDIHALER 80 MCG/ACT IN AERB: 2.0000 | INHALATION_SPRAY | Freq: Two times a day (BID) | RESPIRATORY_TRACT | 5 refills | Status: DC

## 2023-03-04 MED ORDER — PREDNISONE 10 MG PO TABS: ORAL_TABLET | ORAL | 0 refills | Status: DC

## 2023-03-04 NOTE — Assessment & Plan Note (Signed)
Ghm utd Check labs  See AVS  Health Maintenance  Topic Date Due   HIV Screening  Never done   Hepatitis C Screening  Never done   COVID-19 Vaccine (4 - 2023-24 season) 07/03/2022   INFLUENZA VACCINE  06/03/2023   DTaP/Tdap/Td (7 - Td or Tdap) 05/23/2024   HPV VACCINES  Completed

## 2023-03-04 NOTE — Assessment & Plan Note (Signed)
Lexapro daily

## 2023-03-04 NOTE — Progress Notes (Signed)
Subjective:   By signing my name below, I, Madison Hansen, attest that this documentation has been prepared under the direction and in the presence of [name], DO. 03/04/2023   Patient ID: Madison Hansen, female    DOB: 15-Mar-2001, 22 y.o.   MRN: [id number]  Chief Complaint  Patient presents with   Annual Exam    Pt states fasting     HPI Patient is in today for a comprehensive physical exam.   During last visit se was suffering from bronchitis. Since then her symptoms have resolved for a week but returned after that. She complains of cough, chest tightness, and muscle aches since the past weekend. She is taking OTC cough medication, Flonase, and allergy medication. She continues using albuterol inhaler 2-3x daily for her symptoms. She is not using her Qvar inhaler due to losing it. She denies sore throat.  She is no longer following up with a neurologist for her eyes.  She denies fever, new moles, congestion, sinus pain, chest pain, palpitations, wheezing, nausea, vomiting, abdominal pain, diarrhea, constipation, dysuria, frequency, hematuria, new muscle pain, new joint pain, or headaches at this time.  She has no new surgeries to report. She has no changes to family medical history. She occasionally drinks 2-3 shots of liquor every other weekend. She does not use drugs. She does not use tobacco products or vaping products. She is not sexually active.  She participates in regular exercise by doing weight training and running on the treadmill 3-d days weekly for at least 1 hours.  She is not seeing a GYN specialist at this time.  She is currently on her menstrual cycle. She reports developing abdominal cramps when having her menstrual cycle. She occasionally takes OTC medications to manage her cramps and finds occasional relief.  She is UTD on dental and vision care.    Past Medical History:  Diagnosis Date   Asthma    Environmental allergies     Past Surgical  History:  Procedure Laterality Date   TONSILLECTOMY  2008    History reviewed. No pertinent family history.  Social History   Socioeconomic History   Marital status: Single    Spouse name: Not on file   Number of children: Not on file   Years of education: Not on file   Highest education level: Not on file  Occupational History    Employer: UNEMPLOYED   Occupation: [employer]  Tobacco Use   Smoking status: Former    Types: E-cigarettes   Smokeless tobacco: Never  Vaping Use   Vaping Use: Former  Substance and Sexual Activity   Alcohol use: Yes    Alcohol/week: 3.0 standard drinks of alcohol    Types: 3 Shots of liquor per week   Drug use: No   Sexual activity: Not Currently    Partners: Male  Other Topics Concern   Not on file  Social History Narrative   Exercise -- weights, running -- 3-4 days a week for 1 - 1 1/2 hours    Social Determinants of Health   Financial Resource Strain: Not on file  Food Insecurity: Not on file  Transportation Needs: Not on file  Physical Activity: Not on file  Stress: Not on file  Social Connections: Not on file  Intimate Partner Violence: Not on file    Outpatient Medications Prior to Visit  Medication Sig Dispense Refill   albuterol (VENTOLIN HFA) 108 (90 Base) MCG/ACT inhaler Inhale 2 puffs into the  lungs every 6 (six) hours as needed. For shortness of breath 1 each 5   beclomethasone (QVAR) 40 MCG/ACT inhaler Inhale 2 puffs into the lungs 2 (two) times daily.     ergocalciferol (VITAMIN D2) 1.25 MG (50000 UT) capsule Take 1 capsule (50,000 Units total) by mouth once a week. 12 capsule 1   fluticasone (FLONASE) 50 MCG/ACT nasal spray Place 2 sprays into both nostrils daily. 16 g 6   ibuprofen (ADVIL,MOTRIN) 100 MG chewable tablet Chew 200 mg by mouth every 8 (eight) hours as needed. For fever     levocetirizine (XYZAL) 5 MG tablet Take 1 tablet (5 mg total) by mouth every evening. 30 tablet 5   loratadine (CLARITIN) 5 MG  chewable tablet Chew 5 mg by mouth daily.     pantoprazole (PROTONIX) 40 MG tablet Take 1 tablet (40 mg total) by mouth daily. 90 tablet 1   beclomethasone (QVAR REDIHALER) 80 MCG/ACT inhaler Inhale 2 puffs into the lungs 2 (two) times daily. 1 each 5   sertraline (ZOLOFT) 50 MG tablet Take 1 tablet (50 mg total) by mouth daily. 90 tablet 1   azithromycin (ZITHROMAX Z-PAK) 250 MG tablet As directed (Patient not taking: Reported on 03/04/2023) 6 each 0   fluconazole (DIFLUCAN) 150 MG tablet Take 1 tablet (150 mg total) by mouth daily. May repeat in 3 days if needed. (Patient not taking: Reported on 03/04/2023) 2 tablet 0   predniSONE (DELTASONE) 10 MG tablet TAKE 3 TABLETS PO QD FOR 3 DAYS THEN TAKE 2 TABLETS PO QD FOR 3 DAYS THEN TAKE 1 TABLET PO QD FOR 3 DAYS THEN TAKE 1/2 TAB PO QD FOR 3 DAYS (Patient not taking: Reported on 03/04/2023) 20 tablet 0   No facility-administered medications prior to visit.    Allergies  Allergen Reactions   Tetracyclines & Related Hypertension   Other     Lima Beans: Unknown    Review of Systems  Constitutional:  Negative for fever and malaise/fatigue.  HENT:  Negative for congestion and sinus pain.   Eyes:  Negative for blurred vision.  Respiratory:  Positive for cough. Negative for shortness of breath and wheezing.        (+)chest tightness   Cardiovascular:  Negative for chest pain, palpitations and leg swelling.  Gastrointestinal:  Negative for abdominal pain, blood in stool, constipation, diarrhea, nausea and vomiting.  Genitourinary:  Negative for dysuria, frequency and hematuria.  Musculoskeletal:  Negative for falls.       (+)general muscle aches  Skin:  Negative for rash.       (-)New moles  Neurological:  Negative for dizziness, loss of consciousness and headaches.  Endo/Heme/Allergies:  Negative for environmental allergies.  Psychiatric/Behavioral:  Negative for depression. The patient is not nervous/anxious.        Objective:    Physical  Exam Vitals and nursing note reviewed.  Constitutional:      General: She is not in acute distress.    Appearance: Normal appearance. She is not ill-appearing.  HENT:     Head: Normocephalic and atraumatic.     Right Ear: Tympanic membrane, ear canal and external ear normal.     Left Ear: Tympanic membrane, ear canal and external ear normal.     Mouth/Throat:     Mouth: Mucous membranes are moist.     Pharynx: Oropharynx is clear. No oropharyngeal exudate or posterior oropharyngeal erythema.  Eyes:     Extraocular Movements: Extraocular movements intact.     Pupils:  Pupils are equal, round, and reactive to light.  Cardiovascular:     Rate and Rhythm: Normal rate and regular rhythm.     Heart sounds: Normal heart sounds. No murmur heard.    No gallop.  Pulmonary:     Effort: Pulmonary effort is normal. No respiratory distress.     Breath sounds: Normal breath sounds. No wheezing or rales.  Abdominal:     General: Bowel sounds are normal. There is no distension.     Palpations: Abdomen is soft.     Tenderness: There is no abdominal tenderness. There is no guarding.  Skin:    General: Skin is warm and dry.  Neurological:     General: No focal deficit present.     Mental Status: She is alert and oriented to person, place, and time.  Psychiatric:        Mood and Affect: Mood normal.        Judgment: Judgment normal.     BP 110/80 (BP Location: Left Arm, Patient Position: Sitting, Cuff Size: Normal)   Pulse 82   Temp 98.5 F (36.9 C) (Oral)   Resp 18   Ht 5\' 4"  (1.626 m)   Wt 200 lb (90.7 kg)   LMP 03/04/2023   SpO2 96%   BMI 34.33 kg/m  Wt Readings from Last 3 Encounters:  03/04/23 200 lb (90.7 kg)  02/15/23 193 lb 9.6 oz (87.8 kg)  12/22/22 191 lb 12.8 oz (87 kg) (96 %, Z= 1.81)*   * Growth percentiles are based on CDC (Girls, 2-20 Years) data.       Assessment & Plan:  Preventative health care Assessment & Plan: Ghm utd Check labs  See AVS  Health  Maintenance  Topic Date Due   HIV Screening  Never done   Hepatitis C Screening  Never done   COVID-19 Vaccine (4 - 2023-24 season) 07/03/2022   INFLUENZA VACCINE  06/03/2023   DTaP/Tdap/Td (7 - Td or Tdap) 05/23/2024   HPV VACCINES  Completed     Orders: -     CBC with Differential/Platelet -     Comprehensive metabolic panel -     Lipid panel -     TSH  Bronchitis, asthmatic, mild intermittent, with acute exacerbation -     predniSONE; TAKE 3 TABLETS PO QD FOR 3 DAYS THEN TAKE 2 TABLETS PO QD FOR 3 DAYS THEN TAKE 1 TABLET PO QD FOR 3 DAYS THEN TAKE 1/2 TAB PO QD FOR 3 DAYS  Dispense: 20 tablet; Refill: 0 -     Azithromycin; As directed  Dispense: 6 each; Refill: 0 -     Qvar RediHaler; Inhale 2 puffs into the lungs 2 (two) times daily.  Dispense: 1 each; Refill: 5 -     COVID-19, Flu A+B and RSV  Idiopathic intracranial hypertension  Depression with anxiety -     Escitalopram Oxalate; Take 1 tablet (10 mg total) by mouth daily.  Dispense: 30 tablet; Refill: 2  PMDD (premenstrual dysphoric disorder) Assessment & Plan: Lexapro daily       I, [name], DO, personally preformed the services described in this documentation.  All medical record entries made by the scribe were at my direction and in my presence.  I have reviewed the chart and discharge instructions (if applicable) and agree that the record reflects my personal performance and is accurate and complete. 03/04/2023   I,Madison Hansen,acting as a scribe for [employer], DO.,have documented all  relevant documentation on the behalf of Miquel Held, DO,as directed by  Luevenia Held, DO while in the presence of Dioselina Held, DO.   Melony Held, DO

## 2023-03-04 NOTE — Telephone Encounter (Signed)
Alternative Requested:$271 ON INSURANCE. ANY OTHER OPTIONS?

## 2023-03-05 LAB — LIPID PANEL
Cholesterol: 139 mg/dL (ref 0–200)
HDL: 52.7 mg/dL (ref >39.00)
LDL Cholesterol: 77 mg/dL (ref 0–99)
NonHDL: 86.78
Total CHOL/HDL Ratio: 3
Triglycerides: 50 mg/dL (ref 0.0–149.0)
VLDL: 10 mg/dL (ref 0.0–40.0)

## 2023-03-05 LAB — COMPREHENSIVE METABOLIC PANEL
ALT: 11 U/L (ref 0–35)
AST: 18 U/L (ref 0–37)
Albumin: 3.7 g/dL (ref 3.5–5.2)
Alkaline Phosphatase: 41 U/L (ref 39–117)
BUN: 6 mg/dL (ref 6–23)
CO2: 27 mEq/L (ref 19–32)
Calcium: 8.6 mg/dL (ref 8.4–10.5)
Chloride: 107 mEq/L (ref 96–112)
Creatinine, Ser: 0.8 mg/dL (ref 0.40–1.20)
GFR: 106.24 mL/min (ref >60.00)
Glucose, Bld: 68 mg/dL — ABNORMAL LOW (ref 70–99)
Potassium: 4.1 mEq/L (ref 3.5–5.1)
Sodium: 141 mEq/L (ref 135–145)
Total Bilirubin: 0.5 mg/dL (ref 0.2–1.2)
Total Protein: 6.3 g/dL (ref 6.0–8.3)

## 2023-03-05 LAB — CBC WITH DIFFERENTIAL/PLATELET
Basophils Absolute: 0 10*3/uL (ref 0.0–0.1)
Basophils Relative: 0.7 % (ref 0.0–3.0)
Eosinophils Absolute: 0.5 10*3/uL (ref 0.0–0.7)
Eosinophils Relative: 6.8 % — ABNORMAL HIGH (ref 0.0–5.0)
HCT: 38.8 % (ref 36.0–46.0)
Hemoglobin: 12.9 g/dL (ref 12.0–15.0)
Lymphocytes Relative: 16.8 % (ref 12.0–46.0)
Lymphs Abs: 1.1 10*3/uL (ref 0.7–4.0)
MCHC: 33.2 g/dL (ref 30.0–36.0)
MCV: 88 fl (ref 78.0–100.0)
Monocytes Absolute: 0.6 10*3/uL (ref 0.1–1.0)
Monocytes Relative: 9.3 % (ref 3.0–12.0)
Neutro Abs: 4.5 10*3/uL (ref 1.4–7.7)
Neutrophils Relative %: 66.4 % (ref 43.0–77.0)
Platelets: 252 10*3/uL (ref 150.0–400.0)
RBC: 4.41 Mil/uL (ref 3.87–5.11)
RDW: 13.5 % (ref 11.5–14.6)
WBC: 6.7 10*3/uL (ref 4.5–10.5)

## 2023-03-05 LAB — TSH: TSH: 1.41 u[IU]/mL (ref 0.35–5.50)

## 2023-03-06 LAB — COVID-19, FLU A+B AND RSV
Influenza A, NAA: NOT DETECTED
Influenza B, NAA: NOT DETECTED
RSV, NAA: NOT DETECTED
SARS-CoV-2, NAA: NOT DETECTED

## 2023-03-08 ENCOUNTER — Encounter: Payer: Self-pay | Admitting: Family Medicine

## 2023-03-08 NOTE — Progress Notes (Signed)
Letter Mailed

## 2023-03-15 ENCOUNTER — Encounter: Payer: Self-pay | Admitting: Family Medicine

## 2023-03-15 DIAGNOSIS — Z111 Encounter for screening for respiratory tuberculosis: Secondary | ICD-10-CM

## 2023-03-26 ENCOUNTER — Other Ambulatory Visit: Payer: Self-pay | Admitting: Family Medicine

## 2023-03-26 DIAGNOSIS — F418 Other specified anxiety disorders: Secondary | ICD-10-CM

## 2023-04-13 NOTE — Telephone Encounter (Signed)
Appt scheduled 04/20/23.

## 2023-04-20 ENCOUNTER — Encounter: Payer: Self-pay | Admitting: Family Medicine

## 2023-04-20 ENCOUNTER — Ambulatory Visit: Payer: BC Managed Care – PPO | Admitting: Family Medicine

## 2023-04-20 VITALS — BP 118/66 | HR 57 | Temp 99.4°F | Resp 12 | Ht 64.0 in | Wt 203.2 lb

## 2023-04-20 DIAGNOSIS — Z111 Encounter for screening for respiratory tuberculosis: Secondary | ICD-10-CM

## 2023-04-20 NOTE — Assessment & Plan Note (Signed)
Tb gold test done Paperwork filled out for travel abroad

## 2023-04-20 NOTE — Progress Notes (Signed)
Established Patient Office Visit  Subjective   Patient ID: Madison Hansen, female    DOB: 11-09-2000  Age: 22 y.o. MRN: [id number]  Chief Complaint  Patient presents with  . paperwork to study abroad    HPI Discussed the use of AI scribe software for clinical note transcription with the patient, who gave verbal consent to proceed.  History of Present Illness   The patient, with a history of asthma and anxiety/depression, presents for a pre-travel consultation. She is planning a month-long trip to [place] for a Development worker, community. She is currently taking Lexapro, Claritin, and Tylenol, and uses Pulmicort and albuterol inhalers for asthma management. She reports no recent asthma symptoms or difficulties, even with physical exertion. She also reports occasional chest pain, but quickly dismisses it as not significant. She has a history of idiopathic injury which required hospitalization last summer. She also had a tonsillectomy in 2008. She reports no other significant health issues. She is currently seeing a therapist for management of her anxiety/depression, which is well-controlled with medication.      Patient Active Problem List   Diagnosis Date Noted  . Epidermoid cyst 12/22/2022  . Acute non-recurrent pansinusitis 12/22/2022  . Abscess, perirectal 11/10/2022  . Seasonal affective disorder (HCC) 09/29/2022  . PMDD (premenstrual dysphoric disorder) 09/29/2022  . Midepigastric pain 09/29/2022  . RUQ pain 09/29/2022  . Idiopathic intracranial hypertension 06/15/2022  . Mild intermittent asthma without complication 02/15/2022  . Cold sore 02/15/2022  . Seasonal allergies 02/15/2022  . Preventative health care 02/15/2022  . Acne vulgaris 02/15/2022   Past Medical History:  Diagnosis Date  . Asthma   . Environmental allergies    Past Surgical History:  Procedure Laterality Date  . TONSILLECTOMY  2008   Social History   Tobacco Use  . Smoking status:  Former    Types: E-cigarettes  . Smokeless tobacco: Never  Vaping Use  . Vaping Use: Former  Substance Use Topics  . Alcohol use: Yes    Alcohol/week: 3.0 standard drinks of alcohol    Types: 3 Shots of liquor per week  . Drug use: No   Social History   Socioeconomic History  . Marital status: Single    Spouse name: Not on file  . Number of children: Not on file  . Years of education: Not on file  . Highest education level: Not on file  Occupational History    Employer: UNEMPLOYED  . Occupation: [employer]  Tobacco Use  . Smoking status: Former    Types: E-cigarettes  . Smokeless tobacco: Never  Vaping Use  . Vaping Use: Former  Substance and Sexual Activity  . Alcohol use: Yes    Alcohol/week: 3.0 standard drinks of alcohol    Types: 3 Shots of liquor per week  . Drug use: No  . Sexual activity: Not Currently    Partners: Male  Other Topics Concern  . Not on file  Social History Narrative   Exercise -- weights, running -- 3-4 days a week for 1 - 1 1/2 hours    Social Determinants of Health   Financial Resource Strain: Not on file  Food Insecurity: Not on file  Transportation Needs: Not on file  Physical Activity: Not on file  Stress: Not on file  Social Connections: Not on file  Intimate Partner Violence: Not on file   Family Status  Relation Name Status  . Mother  Alive  . Father  Alive  . Brother  Alive  . MGM  Alive  . MGF  Deceased  . PGM  Alive  . PGF  Deceased   No family history on file. Allergies  Allergen Reactions  . Tetracyclines & Related Hypertension  . Other     Lima Beans: Unknown      Review of Systems  Constitutional:  Negative for fever and malaise/fatigue.  HENT:  Negative for congestion.   Eyes:  Negative for blurred vision.  Respiratory:  Negative for shortness of breath.   Cardiovascular:  Negative for chest pain, palpitations and leg swelling.  Gastrointestinal:  Negative for abdominal pain, blood in stool and nausea.   Genitourinary:  Negative for dysuria and frequency.  Musculoskeletal:  Negative for falls.  Skin:  Negative for rash.  Neurological:  Negative for dizziness, loss of consciousness and headaches.  Endo/Heme/Allergies:  Negative for environmental allergies.  Psychiatric/Behavioral:  Negative for depression. The patient is not nervous/anxious.      Objective:     BP 118/66 (BP Location: Left Arm, Cuff Size: Large)   Pulse (!) 57   Temp 99.4 F (37.4 C) (Oral)   Resp 12   Ht 5\' 4"  (1.626 m)   Wt 203 lb 3.2 oz (92.2 kg)   SpO2 94%   BMI 34.88 kg/m  BP Readings from Last 3 Encounters:  04/20/23 118/66  03/04/23 110/80  02/15/23 110/78   Wt Readings from Last 3 Encounters:  04/20/23 203 lb 3.2 oz (92.2 kg)  03/04/23 200 lb (90.7 kg)  02/15/23 193 lb 9.6 oz (87.8 kg)   SpO2 Readings from Last 3 Encounters:  04/20/23 94%  03/04/23 96%  02/15/23 97%      Physical Exam Vitals and nursing note reviewed.  Constitutional:      Appearance: She is well-developed.  HENT:     Head: Normocephalic and atraumatic.  Eyes:     Conjunctiva/sclera: Conjunctivae normal.  Neck:     Thyroid: No thyromegaly.     Vascular: No carotid bruit or JVD.  Cardiovascular:     Rate and Rhythm: Normal rate and regular rhythm.     Heart sounds: Normal heart sounds. No murmur heard. Pulmonary:     Effort: Pulmonary effort is normal. No respiratory distress.     Breath sounds: Normal breath sounds. No wheezing or rales.  Chest:     Chest wall: No tenderness.  Musculoskeletal:     Cervical back: Normal range of motion and neck supple.  Neurological:     General: No focal deficit present.     Mental Status: She is alert and oriented to person, place, and time.  Psychiatric:        Mood and Affect: Mood normal.        Thought Content: Thought content normal.    No results found for any visits on 04/20/23.  Last CBC Lab Results  Component Value Date   WBC 6.7 03/04/2023   HGB 12.9  03/04/2023   HCT 38.8 03/04/2023   MCV 88.0 03/04/2023   MCH 27.6 09/24/2018   RDW 13.5 03/04/2023   PLT 252.0 03/04/2023   Last metabolic panel Lab Results  Component Value Date   GLUCOSE 68 (L) 03/04/2023   NA 141 03/04/2023   K 4.1 03/04/2023   CL 107 03/04/2023   CO2 27 03/04/2023   BUN 6 03/04/2023   CREATININE 0.80 03/04/2023   GFRNONAA NOT CALCULATED 09/24/2018   CALCIUM 8.6 03/04/2023   PROT 6.3 03/04/2023   ALBUMIN 3.7 03/04/2023  BILITOT 0.5 03/04/2023   ALKPHOS 41 03/04/2023   AST 18 03/04/2023   ALT 11 03/04/2023   ANIONGAP 9 09/24/2018   Last lipids Lab Results  Component Value Date   CHOL 139 03/04/2023   HDL 52.70 03/04/2023   LDLCALC 77 03/04/2023   TRIG 50.0 03/04/2023   CHOLHDL 3 03/04/2023   Last hemoglobin A1c No results found for: "HGBA1C" Last thyroid functions Lab Results  Component Value Date   TSH 1.41 03/04/2023   Last vitamin D Lab Results  Component Value Date   VD25OH 17.71 (L) 06/15/2022   Last vitamin B12 and Folate Lab Results  Component Value Date   VITAMINB12 330 06/15/2022      The ASCVD Risk score (Arnett DK, et al., 2019) failed to calculate for the following reasons:   The 2019 ASCVD risk score is only valid for ages 1 to 52    Assessment & Plan:   Problem List Items Addressed This Visit   None Visit Diagnoses     Screening-pulmonary TB    -  Primary       No follow-ups on file.    [name], DO

## 2023-04-22 ENCOUNTER — Ambulatory Visit: Payer: BC Managed Care – PPO | Admitting: Family Medicine

## 2023-04-23 LAB — QUANTIFERON-TB GOLD PLUS
Mitogen-NIL: 9.64 IU/mL
NIL: 0.01 IU/mL
QuantiFERON-TB Gold Plus: NEGATIVE
TB1-NIL: 0 IU/mL
TB2-NIL: 0 IU/mL

## 2023-09-06 ENCOUNTER — Telehealth: Payer: Self-pay

## 2023-09-06 NOTE — Telephone Encounter (Signed)
Caller Name Jaylene Arrowood Caller Phone Number [phone] Patient Name Madison Hansen Patient DOB 07-18-2001 Call Type Message Only Information Provided Reason for Call Request for General Office Information Initial Comment Caller stated that they need to know their login information for mychart. Disp. Time Disposition Final User 09/04/2023 1:12:30 PM General Information Provided Yes McGehee, April Call Closed By: April McGehee Transaction Date/Time: 09/04/2023 1:10:38 PM (ET)

## 2023-10-06 ENCOUNTER — Encounter: Payer: Self-pay | Admitting: Family Medicine

## 2023-10-06 DIAGNOSIS — R635 Abnormal weight gain: Secondary | ICD-10-CM

## 2023-10-23 ENCOUNTER — Other Ambulatory Visit: Payer: Self-pay | Admitting: Family Medicine

## 2023-10-23 DIAGNOSIS — F418 Other specified anxiety disorders: Secondary | ICD-10-CM

## 2023-12-02 ENCOUNTER — Encounter: Payer: Self-pay | Admitting: Bariatrics

## 2023-12-02 ENCOUNTER — Ambulatory Visit: Payer: 59 | Admitting: Bariatrics

## 2023-12-02 VITALS — BP 106/68 | HR 55 | Temp 98.1°F | Ht 64.0 in | Wt 199.0 lb

## 2023-12-02 DIAGNOSIS — E559 Vitamin D deficiency, unspecified: Secondary | ICD-10-CM

## 2023-12-02 DIAGNOSIS — G932 Benign intracranial hypertension: Secondary | ICD-10-CM | POA: Diagnosis not present

## 2023-12-02 DIAGNOSIS — E66811 Obesity, class 1: Secondary | ICD-10-CM

## 2023-12-02 DIAGNOSIS — Z6834 Body mass index (BMI) 34.0-34.9, adult: Secondary | ICD-10-CM

## 2023-12-02 DIAGNOSIS — E6609 Other obesity due to excess calories: Secondary | ICD-10-CM

## 2023-12-02 NOTE — Progress Notes (Signed)
Office: [phone]  /  Fax: [phone]   Initial Visit  Madison Hansen was seen in clinic today to evaluate for obesity. She is interested in losing weight to improve overall health and reduce the risk of weight related complications. She presents today to review program treatment options, initial physical assessment, and evaluation.     She was referred by: PCP  When asked what else they would like to accomplish? She states: Adopt healthier eating patterns, Improve energy levels and physical activity, and Improve existing medical conditions  When asked how has your weight affected you? She states: Contributed to medical problems, Having fatigue, and Having poor endurance  Some associated conditions: Other: vitamin D deficiency, and  idiopathic intracranial hypertension.   Contributing factors: Family history of obesity  Weight promoting medications identified: None  Current nutrition plan: None and Vegetarian, Vegan   Current level of physical activity: None, Yoga 30-60 minutes, and Other: 45 minutes daily   Current or previous pharmacotherapy: None  Response to medication: Never tried medications   Past medical history includes:   Past Medical History:  Diagnosis Date   Asthma    Environmental allergies      Objective:   BP 106/68   Pulse (!) 55   Temp 98.1 F (36.7 C)   Ht 5\' 4"  (1.626 m)   Wt 199 lb (90.3 kg)   SpO2 99%   BMI 34.16 kg/m  She was weighed on the bioimpedance scale: Body mass index is 34.16 kg/m.  Peak Weight:210 lbsd , Body Fat%:40.4 %, Visceral Fat Rating:7, Weight trend over the last 12 months: Increasing  General:  Alert, oriented and cooperative. Patient is in no acute distress.  Respiratory: Normal respiratory effort, no problems with respiration noted  Extremities: Normal range of motion.    Mental Status: Normal mood and affect. Normal behavior. Normal judgment and thought content.   DIAGNOSTIC DATA REVIEWED:  BMET     Component Value Date/Time   NA 141 03/04/2023 1511   K 4.1 03/04/2023 1511   CL 107 03/04/2023 1511   CO2 27 03/04/2023 1511   GLUCOSE 68 (L) 03/04/2023 1511   BUN 6 03/04/2023 1511   CREATININE 0.80 03/04/2023 1511   CALCIUM 8.6 03/04/2023 1511   GFRNONAA NOT CALCULATED 09/24/2018 1500   GFRAA NOT CALCULATED 09/24/2018 1500   No results found for: "HGBA1C" No results found for: "INSULIN" CBC    Component Value Date/Time   WBC 6.7 03/04/2023 1511   RBC 4.41 03/04/2023 1511   HGB 12.9 03/04/2023 1511   HCT 38.8 03/04/2023 1511   PLT 252.0 03/04/2023 1511   MCV 88.0 03/04/2023 1511   MCH 27.6 09/24/2018 1500   MCHC 33.2 03/04/2023 1511   RDW 13.5 03/04/2023 1511   Iron/TIBC/Ferritin/ %Sat No results found for: "IRON", "TIBC", "FERRITIN", "IRONPCTSAT" Lipid Panel     Component Value Date/Time   CHOL 139 03/04/2023 1511   TRIG 50.0 03/04/2023 1511   HDL 52.70 03/04/2023 1511   CHOLHDL 3 03/04/2023 1511   VLDL 10.0 03/04/2023 1511   LDLCALC 77 03/04/2023 1511   Hepatic Function Panel     Component Value Date/Time   PROT 6.3 03/04/2023 1511   ALBUMIN 3.7 03/04/2023 1511   AST 18 03/04/2023 1511   ALT 11 03/04/2023 1511   ALKPHOS 41 03/04/2023 1511   BILITOT 0.5 03/04/2023 1511      Component Value Date/Time   TSH 1.41 03/04/2023 1511     Assessment and Plan:  Idiopathic intracranial hypertension:  She has a history of idiopathic intracranial hypertension.  She states that she had a workup for this by a neurologist in 2023.  She had imaging at that time but no lumbar puncture and was given diuretics.  She states that currently she is not scheduled with a neurologist and was told on to come back as needed.  Plan: Will work on her plan and exercise when she enrolls. She will see her neurologist if signs and symptoms reappear..   Vitamin D Deficiency  She is at risk for vitamin d deficiency secondary to weight.  Lab Results  Component Value Date   VD25OH  17.71 (L) 06/15/2022   VD25OH 7.11 (L) 02/02/2022    Plan:  Will check her vitamin D at her initial visit.   Class 1 obesity due to excess calories with serious comorbidity and body mass index (BMI) of 34.0 to 34.9 in adult     Generalized Obesity: Current BMI 34    Obesity Treatment / Action Plan:  Patient will work on garnering support from family and friends to begin weight loss journey. Will work on eliminating or reducing the presence of highly palatable, calorie dense foods in the home. Will complete provided nutritional and psychosocial assessment questionnaire before the next appointment. Will be scheduled for indirect calorimetry to determine resting energy expenditure in a fasting state.  This will allow [place] to create a reduced calorie, high-protein meal plan to promote loss of fat mass while preserving muscle mass. Counseled on the health benefits of losing 5%-15% of total body weight. Was counseled on nutritional approaches to weight loss and benefits of reducing processed foods and consuming plant-based foods and high quality protein as part of nutritional weight management. Was counseled on pharmacotherapy and role as an adjunct in weight management.   Obesity Education Performed Today:  She was weighed on the bioimpedance scale and results were discussed and documented in the synopsis.  We discussed obesity as a disease and the importance of a more detailed evaluation of all the factors contributing to the disease.  We discussed the importance of long term lifestyle changes which include nutrition, exercise and behavioral modifications as well as the importance of customizing this to her specific health and social needs.  We discussed the benefits of reaching a healthier weight to alleviate the symptoms of existing conditions and reduce the risks of the biomechanical, metabolic and psychological effects of obesity.  Discussed New Patient/Late Arrival, and Cancellation  Policies. Patient voiced understanding and allowed to ask questions.   Tony Annel Zunker appears to be in the action stage of change and states they are ready to start intensive lifestyle modifications and behavioral modifications.  30 minutes was spent today on this visit including the above counseling, pre-visit chart review, and post-visit documentation.  Reviewed by clinician on day of visit: allergies, medications, problem list, medical history, surgical history, family history, social history, and previous encounter notes.    Dorna Mallet A. Lorretta HarpO.

## 2024-06-14 ENCOUNTER — Other Ambulatory Visit: Payer: Self-pay | Admitting: Family Medicine

## 2024-06-14 DIAGNOSIS — F418 Other specified anxiety disorders: Secondary | ICD-10-CM

## 2024-08-27 ENCOUNTER — Encounter: Payer: Self-pay | Admitting: Family Medicine

## 2024-10-18 ENCOUNTER — Encounter: Payer: Self-pay | Admitting: Family Medicine

## 2024-10-19 MED ORDER — VALACYCLOVIR HCL 1 G PO TABS: 1000.0000 mg | ORAL_TABLET | Freq: Three times a day (TID) | ORAL | 2 refills | Status: AC

## 2024-10-30 ENCOUNTER — Encounter: Payer: Self-pay | Admitting: Family Medicine

## 2024-10-31 ENCOUNTER — Ambulatory Visit: Payer: Self-pay | Admitting: Family Medicine

## 2024-10-31 ENCOUNTER — Encounter: Payer: Self-pay | Admitting: Family Medicine

## 2024-10-31 VITALS — BP 98/68 | HR 54 | Temp 97.7°F | Resp 16 | Ht 64.0 in | Wt 178.4 lb

## 2024-10-31 DIAGNOSIS — Z6834 Body mass index (BMI) 34.0-34.9, adult: Secondary | ICD-10-CM | POA: Diagnosis not present

## 2024-10-31 DIAGNOSIS — E559 Vitamin D deficiency, unspecified: Secondary | ICD-10-CM | POA: Insufficient documentation

## 2024-10-31 DIAGNOSIS — R41840 Attention and concentration deficit: Secondary | ICD-10-CM

## 2024-10-31 DIAGNOSIS — R234 Changes in skin texture: Secondary | ICD-10-CM | POA: Diagnosis not present

## 2024-10-31 DIAGNOSIS — Z0000 Encounter for general adult medical examination without abnormal findings: Secondary | ICD-10-CM

## 2024-10-31 DIAGNOSIS — F418 Other specified anxiety disorders: Secondary | ICD-10-CM | POA: Diagnosis not present

## 2024-10-31 DIAGNOSIS — Z23 Encounter for immunization: Secondary | ICD-10-CM

## 2024-10-31 DIAGNOSIS — E66811 Obesity, class 1: Secondary | ICD-10-CM | POA: Diagnosis not present

## 2024-10-31 DIAGNOSIS — E6609 Other obesity due to excess calories: Secondary | ICD-10-CM | POA: Diagnosis not present

## 2024-10-31 MED ORDER — LISDEXAMFETAMINE DIMESYLATE 30 MG PO CAPS: 30.0000 mg | ORAL_CAPSULE | Freq: Every day | ORAL | 0 refills | Status: AC

## 2024-10-31 NOTE — Progress Notes (Signed)
 "  Subjective:    Patient ID: Madison Hansen, female    DOB: Mar 17, 2001, 23 y.o.   MRN: [id number]  Chief Complaint  Patient presents with   Annual Exam    Pt states not fasting     HPI Patient is in today for cpe.  Discussed the use of AI scribe software for clinical note transcription with the patient, who gave verbal consent to proceed.  History of Present Illness Madison Hansen is a 23 year old female who presents for an annual physical exam and immunizations for school.  She is a armed forces training and education officer and plans to pursue further clinical experience and internships to strengthen her application for veterinary school. She consumes approximately three shots of alcohol per week, denies drug use, and is physically active, engaging in weightlifting and running. She is sexually active with one partner, uses condoms, and is not on birth control, expressing reluctance to use it.  She is concerned about her toenails turning black and her feet peeling, describing it as 'really gross and creepy'. No itchiness is present, but her feet are 'a little ashy'.  She discusses her mental health, mentioning a history of depression and anxiety. Her therapist suggested an assessment for ADD or ADHD due to difficulties with completing work consistently, forgetfulness, and time management issues. She recalls similar challenges with homework since elementary school but denies being fidgety.  She is currently taking valacyclovir  for cold sores and missed a dose around Christmas. She was taking it because she had a cold sore before Christmas.    Past Medical History:  Diagnosis Date   Asthma    Environmental allergies     Past Surgical History:  Procedure Laterality Date   TONSILLECTOMY  2008    History reviewed. No pertinent family history.  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, clinical (histocompatibility and immunogenetics)  2026  Tobacco Use   Smoking status: Former    Types: E-cigarettes   Smokeless tobacco: Never  Vaping Use   Vaping status: Former  Substance and Sexual Activity   Alcohol use: Yes    Alcohol/week: 3.0 standard drinks of alcohol    Types: 3 Shots of liquor per week   Drug use: No   Sexual activity: Yes    Partners: Male    Birth control/protection: Condom  Other Topics Concern   Not on file  Social History Narrative   Exercise -- weights, running -- 3-4 days a week for 1 - 1 1/2 hours    Social Drivers of Health   Tobacco Use: Medium Risk (10/31/2024)   Patient History    Smoking Tobacco Use: Former    Smokeless Tobacco Use: Never    Passive Exposure: Not on Actuary Strain: Not on file  Food Insecurity: Not on file  Transportation Needs: Not on file  Physical Activity: Not on file  Stress: Not on file  Social Connections: Not on file  Intimate Partner Violence: Not on file  Depression (PHQ2-9): High Risk (10/31/2024)   Depression (PHQ2-9)    PHQ-2 Score: 11  Alcohol Screen: Not on file  Housing: Not on file  Utilities: Not on file  Health Literacy: Not on file    Outpatient Medications Prior to Visit  Medication Sig Dispense Refill   albuterol  (VENTOLIN  HFA) 108 (90 Base) MCG/ACT inhaler Inhale 2 puffs into the lungs every 6 (six) hours as needed. For shortness of breath 1 each 5   beclomethasone (QVAR ) 40 MCG/ACT inhaler Inhale 2 puffs into the lungs 2 (two) times daily.     budesonide (PULMICORT FLEXHALER) 180 MCG/ACT inhaler Inhale 2 puffs into the lungs daily. 3 each 3   ergocalciferol  (VITAMIN D2) 1.25 MG (50000 UT) capsule Take 1 capsule (50,000 Units total) by mouth once a week. 12 capsule 1   escitalopram  (LEXAPRO ) 10 MG tablet TAKE 1 TABLET BY MOUTH EVERY DAY 90  tablet 1   fluticasone  (FLONASE ) 50 MCG/ACT nasal spray Place 2 sprays into both nostrils daily. 16 g 6   ibuprofen  (ADVIL ,MOTRIN ) 100 MG chewable tablet Chew 200 mg by mouth every 8 (eight) hours as needed. For fever     levocetirizine (XYZAL ) 5 MG tablet Take 1 tablet (5 mg total) by mouth every evening. 30 tablet 5   loratadine (CLARITIN) 5 MG chewable tablet Chew 5 mg by mouth daily.     pantoprazole  (PROTONIX ) 40 MG tablet Take 1 tablet (40 mg total) by mouth daily. 90 tablet 1   valACYclovir  (VALTREX ) 1000 MG tablet Take 1 tablet (1,000 mg total) by mouth 3 (three) times daily. 30 tablet 2   predniSONE  (DELTASONE ) 10 MG tablet TAKE 3 TABLETS PO QD FOR 3 DAYS THEN TAKE 2 TABLETS PO QD FOR 3 DAYS THEN TAKE 1 TABLET PO QD FOR 3 DAYS THEN TAKE 1/2 TAB PO QD FOR 3 DAYS 20 tablet 0   azithromycin  (ZITHROMAX  Z-PAK) 250 MG tablet As directed (Patient not taking: Reported on 12/02/2023) 6 each 0   azithromycin  (ZITHROMAX  Z-PAK) 250 MG tablet As directed 6 each 0   fluconazole  (DIFLUCAN ) 150 MG tablet Take 1 tablet (150 mg total) by mouth daily. May repeat in 3 days if needed. 2 tablet 0   predniSONE  (DELTASONE ) 10 MG tablet TAKE 3 TABLETS PO QD FOR 3 DAYS THEN TAKE 2 TABLETS PO QD FOR 3 DAYS THEN TAKE 1 TABLET PO QD FOR 3 DAYS THEN TAKE 1/2 TAB PO QD FOR 3 DAYS 20 tablet 0   No facility-administered medications prior to visit.    Allergies  Allergen Reactions   Tetracyclines & Related Hypertension   Other     Lima Beans: Unknown    Review of Systems  Constitutional:  Negative for fever and malaise/fatigue.  HENT:  Negative for congestion.   Eyes:  Negative for blurred vision.  Respiratory:  Negative for cough and shortness of breath.   Cardiovascular:  Negative for chest pain, palpitations and leg swelling.  Gastrointestinal:  Negative for abdominal pain, blood in stool, nausea and vomiting.  Genitourinary:  Negative for dysuria and frequency.  Musculoskeletal:  Negative for back pain and  falls.  Skin:  Negative for rash.  Neurological:  Negative for dizziness, loss of consciousness and headaches.  Endo/Heme/Allergies:  Negative for environmental allergies.  Psychiatric/Behavioral:  Negative for depression. The patient is not nervous/anxious.        Objective:    Physical Exam Vitals and nursing note reviewed.  Constitutional:      General: She is not in acute distress.    Appearance: Normal appearance. She is well-developed.  HENT:     Head: Normocephalic and atraumatic.     Right Ear: Tympanic membrane, ear canal  and external ear normal. There is no impacted cerumen.     Left Ear: Tympanic membrane, ear canal and external ear normal. There is no impacted cerumen.     Nose: Nose normal.     Mouth/Throat:     Mouth: Mucous membranes are moist.     Pharynx: Oropharynx is clear. No oropharyngeal exudate or posterior oropharyngeal erythema.  Eyes:     General: No scleral icterus.       Right eye: No discharge.        Left eye: No discharge.     Conjunctiva/sclera: Conjunctivae normal.     Pupils: Pupils are equal, round, and reactive to light.  Neck:     Thyroid: No thyromegaly or thyroid tenderness.     Vascular: No JVD.  Cardiovascular:     Rate and Rhythm: Normal rate and regular rhythm.     Heart sounds: Normal heart sounds. No murmur heard. Pulmonary:     Effort: Pulmonary effort is normal. No respiratory distress.     Breath sounds: Normal breath sounds.  Abdominal:     General: Bowel sounds are normal. There is no distension.     Palpations: Abdomen is soft. There is no mass.     Tenderness: There is no abdominal tenderness. There is no guarding or rebound.  Musculoskeletal:        General: Normal range of motion.     Cervical back: Normal range of motion and neck supple.     Right lower leg: No edema.     Left lower leg: No edema.  Lymphadenopathy:     Cervical: No cervical adenopathy.  Skin:    General: Skin is warm and dry.     Findings: No  erythema or rash.  Neurological:     Mental Status: She is alert and oriented to person, place, and time.     Cranial Nerves: No cranial nerve deficit.     Deep Tendon Reflexes: Reflexes are normal and symmetric.  Psychiatric:        Mood and Affect: Mood normal.        Behavior: Behavior normal.        Thought Content: Thought content normal.        Judgment: Judgment normal.     BP 98/68 (BP Location: Left Arm, Patient Position: Sitting, Cuff Size: Normal)   Pulse (!) 54   Temp 97.7 F (36.5 C) (Oral)   Resp 16   Ht 5' 4 (1.626 m)   Wt 178 lb 6.4 oz (80.9 kg)   LMP 10/10/2024   SpO2 100%   BMI 30.62 kg/m  Wt Readings from Last 3 Encounters:  10/31/24 178 lb 6.4 oz (80.9 kg)  12/02/23 199 lb (90.3 kg)  04/20/23 203 lb 3.2 oz (92.2 kg)    Diabetic Foot Exam - Simple   No data filed    Lab Results  Component Value Date   WBC 6.7 03/04/2023   HGB 12.9 03/04/2023   HCT 38.8 03/04/2023   PLT 252.0 03/04/2023   GLUCOSE 68 (L) 03/04/2023   CHOL 139 03/04/2023   TRIG 50.0 03/04/2023   HDL 52.70 03/04/2023   LDLCALC 77 03/04/2023   ALT 11 03/04/2023   AST 18 03/04/2023   NA 141 03/04/2023   K 4.1 03/04/2023   CL 107 03/04/2023   CREATININE 0.80 03/04/2023   BUN 6 03/04/2023   CO2 27 03/04/2023   TSH 1.41 03/04/2023    Lab Results  Component Value Date  TSH 1.41 03/04/2023   Lab Results  Component Value Date   WBC 6.7 03/04/2023   HGB 12.9 03/04/2023   HCT 38.8 03/04/2023   MCV 88.0 03/04/2023   PLT 252.0 03/04/2023   Lab Results  Component Value Date   NA 141 03/04/2023   K 4.1 03/04/2023   CO2 27 03/04/2023   GLUCOSE 68 (L) 03/04/2023   BUN 6 03/04/2023   CREATININE 0.80 03/04/2023   BILITOT 0.5 03/04/2023   ALKPHOS 41 03/04/2023   AST 18 03/04/2023   ALT 11 03/04/2023   PROT 6.3 03/04/2023   ALBUMIN 3.7 03/04/2023   CALCIUM 8.6 03/04/2023   ANIONGAP 9 09/24/2018   GFR 106.24 03/04/2023   Lab Results  Component Value Date   CHOL 139  03/04/2023   Lab Results  Component Value Date   HDL 52.70 03/04/2023   Lab Results  Component Value Date   LDLCALC 77 03/04/2023   Lab Results  Component Value Date   TRIG 50.0 03/04/2023   Lab Results  Component Value Date   CHOLHDL 3 03/04/2023   No results found for: HGBA1C     Assessment & Plan:  Preventative health care Assessment & Plan: Ghm utd Check labs  See AVS Health Maintenance  Topic Date Due   HIV Screening  Never done   Meningococcal B Vaccine (1 of 2 - Standard) Never done   Hepatitis C Screening  Never done   Pneumococcal Vaccine (1 of 2 - PCV) 12/26/2021   Cervical Cancer Screening (Pap smear)  Never done   COVID-19 Vaccine (4 - 2025-26 season) 07/03/2024   DTaP/Tdap/Td (8 - Td or Tdap) 10/31/2034   Influenza Vaccine  Completed   Hepatitis B Vaccines 19-59 Average Risk  Completed   HPV VACCINES  Completed     Orders: -     CBC with Differential/Platelet -     Comprehensive metabolic panel with GFR -     Lipid panel -     TSH  Need for influenza vaccination -     Flu vaccine trivalent PF, 6mos and older(Flulaval,Afluria,Fluarix,Fluzone)  Need for Tdap vaccination -     Tdap vaccine greater than or equal to 7yo IM  Depression with anxiety Assessment & Plan: Stable    Vitamin D  deficiency  Class 1 obesity due to excess calories with serious comorbidity and body mass index (BMI) of 34.0 to 34.9 in adult  Peeling skin -     Ambulatory referral to Dermatology  Attention or concentration deficit -     Lisdexamfetamine Dimesylate; Take 1 capsule (30 mg total) by mouth daily.  Dispense: 30 capsule; Refill: 0  Assessment and Plan Assessment & Plan Onychomycosis and skin changes of the feet   Black discoloration and peeling of toenails suggest a possible fungal infection, with gel polish potentially obscuring nail visibility. She is referred to a dermatologist for evaluation and management of toenail discoloration and skin  changes.  Attention deficit disorder evaluation   She experiences difficulties with focus, forgetfulness, and task completion, prompting a therapist to suggest an evaluation for ADD/ADHD. Depression and anxiety may be secondary to these conditions. Initiated Vyvanse 30 mg daily. A follow-up in one month will assess the medication's effectiveness. A referral to a psychiatrist will be considered if symptoms persist.  Depression with anxiety   Depression and anxiety may be secondary to ADD/ADHD, with current symptoms including difficulty with focus and task completion.  Herpes labialis   A recent cold sore outbreak occurred  after missing doses of valacyclovir  for a day or two around Christmas. There is no need to restart the full course of the antiviral. Continue valacyclovir  as needed for cold sore outbreaks.  General Health Maintenance   She required tetanus and flu vaccinations for school, which were administered. Immunization records were provided for school.    Mourad Cwikla R Lowne Chase, DO  "

## 2024-10-31 NOTE — Patient Instructions (Signed)

## 2024-10-31 NOTE — Assessment & Plan Note (Signed)
 Ghm utd Check labs  See AVS Health Maintenance  Topic Date Due   HIV Screening  Never done   Meningococcal B Vaccine (1 of 2 - Standard) Never done   Hepatitis C Screening  Never done   Pneumococcal Vaccine (1 of 2 - PCV) 12/26/2021   Cervical Cancer Screening (Pap smear)  Never done   COVID-19 Vaccine (4 - 2025-26 season) 07/03/2024   DTaP/Tdap/Td (8 - Td or Tdap) 10/31/2034   Influenza Vaccine  Completed   Hepatitis B Vaccines 19-59 Average Risk  Completed   HPV VACCINES  Completed

## 2024-10-31 NOTE — Assessment & Plan Note (Signed)
 Stable

## 2024-11-01 LAB — CBC WITH DIFFERENTIAL/PLATELET
Basophils Absolute: 0 K/uL (ref 0.0–0.1)
Basophils Relative: 0.3 % (ref 0.0–3.0)
Eosinophils Absolute: 0.1 K/uL (ref 0.0–0.7)
Eosinophils Relative: 2.1 % (ref 0.0–5.0)
HCT: 40.2 % (ref 36.0–46.0)
Hemoglobin: 13.1 g/dL (ref 12.0–15.0)
Lymphocytes Relative: 33.8 % (ref 12.0–46.0)
Lymphs Abs: 1.9 K/uL (ref 0.7–4.0)
MCHC: 32.7 g/dL (ref 30.0–36.0)
MCV: 87.5 fl (ref 78.0–100.0)
Monocytes Absolute: 0.4 K/uL (ref 0.1–1.0)
Monocytes Relative: 7.7 % (ref 3.0–12.0)
Neutro Abs: 3.1 K/uL (ref 1.4–7.7)
Neutrophils Relative %: 56.1 % (ref 43.0–77.0)
Platelets: 258 K/uL (ref 150.0–400.0)
RBC: 4.6 Mil/uL (ref 3.87–5.11)
RDW: 13.6 % (ref 11.5–15.5)
WBC: 5.6 K/uL (ref 4.0–10.5)

## 2024-11-01 LAB — COMPREHENSIVE METABOLIC PANEL WITH GFR
ALT: 7 U/L (ref 3–35)
AST: 12 U/L (ref 5–37)
Albumin: 4.5 g/dL (ref 3.5–5.2)
Alkaline Phosphatase: 32 U/L — ABNORMAL LOW (ref 39–117)
BUN: 14 mg/dL (ref 6–23)
CO2: 25 meq/L (ref 19–32)
Calcium: 8.9 mg/dL (ref 8.4–10.5)
Chloride: 106 meq/L (ref 96–112)
Creatinine, Ser: 0.82 mg/dL (ref 0.40–1.20)
GFR: 101.94 mL/min
Glucose, Bld: 63 mg/dL — ABNORMAL LOW (ref 70–99)
Potassium: 4.3 meq/L (ref 3.5–5.1)
Sodium: 138 meq/L (ref 135–145)
Total Bilirubin: 0.4 mg/dL (ref 0.2–1.2)
Total Protein: 7 g/dL (ref 6.0–8.3)

## 2024-11-01 LAB — TSH: TSH: 1.06 u[IU]/mL (ref 0.35–5.50)

## 2024-11-01 LAB — LIPID PANEL
Cholesterol: 168 mg/dL (ref 28–200)
HDL: 54.2 mg/dL
LDL Cholesterol: 101 mg/dL — ABNORMAL HIGH (ref 10–99)
NonHDL: 114.03
Total CHOL/HDL Ratio: 3
Triglycerides: 67 mg/dL (ref 10.0–149.0)
VLDL: 13.4 mg/dL (ref 0.0–40.0)

## 2024-11-02 ENCOUNTER — Ambulatory Visit: Payer: Self-pay | Admitting: Family Medicine

## 2024-11-14 ENCOUNTER — Encounter: Payer: Self-pay | Admitting: Family Medicine

## 2024-12-06 ENCOUNTER — Encounter: Payer: Self-pay | Admitting: Family Medicine

## 2025-07-24 ENCOUNTER — Ambulatory Visit: Admitting: Physician Assistant

## 2025-11-05 ENCOUNTER — Encounter: Admitting: Family Medicine
